# Patient Record
Sex: Male | Born: 1955
Health system: Southern US, Community
[De-identification: ages and names within clinical notes are randomized; demographics above are authoritative.]

## PROBLEM LIST (undated history)

## (undated) DIAGNOSIS — E785 Hyperlipidemia, unspecified: Secondary | ICD-10-CM

## (undated) DIAGNOSIS — I10 Essential (primary) hypertension: Secondary | ICD-10-CM

## (undated) HISTORY — DX: Hyperlipidemia, unspecified: E78.5

## (undated) HISTORY — PX: SPINE SURGERY: SHX786

## (undated) HISTORY — PX: HAND SURGERY: SHX662

## (undated) HISTORY — PX: CERVICAL DISC SURGERY: SHX588

---

## 1997-11-13 ENCOUNTER — Emergency Department (HOSPITAL_COMMUNITY): Admission: EM | Admit: 1997-11-13 | Discharge: 1997-11-13 | Payer: Self-pay | Admitting: *Deleted

## 1998-06-27 ENCOUNTER — Encounter: Payer: Self-pay | Admitting: Emergency Medicine

## 1998-06-27 ENCOUNTER — Emergency Department (HOSPITAL_COMMUNITY): Admission: EM | Admit: 1998-06-27 | Discharge: 1998-06-27 | Payer: Self-pay | Admitting: Emergency Medicine

## 2002-06-22 ENCOUNTER — Encounter: Payer: Self-pay | Admitting: Neurosurgery

## 2002-06-22 ENCOUNTER — Ambulatory Visit (HOSPITAL_COMMUNITY): Admission: RE | Admit: 2002-06-22 | Discharge: 2002-06-22 | Payer: Self-pay | Admitting: Neurosurgery

## 2002-06-22 ENCOUNTER — Encounter: Payer: Self-pay | Admitting: Sports Medicine

## 2002-06-24 ENCOUNTER — Ambulatory Visit (HOSPITAL_COMMUNITY): Admission: RE | Admit: 2002-06-24 | Discharge: 2002-06-25 | Payer: Self-pay | Admitting: Neurosurgery

## 2002-06-24 ENCOUNTER — Encounter: Payer: Self-pay | Admitting: Neurosurgery

## 2003-08-08 ENCOUNTER — Ambulatory Visit (HOSPITAL_COMMUNITY): Admission: RE | Admit: 2003-08-08 | Discharge: 2003-08-08 | Payer: Self-pay | Admitting: Family Medicine

## 2004-02-16 ENCOUNTER — Ambulatory Visit (HOSPITAL_COMMUNITY): Admission: RE | Admit: 2004-02-16 | Discharge: 2004-02-16 | Payer: Self-pay | Admitting: Neurosurgery

## 2011-11-20 ENCOUNTER — Inpatient Hospital Stay (HOSPITAL_COMMUNITY)
Admission: EM | Admit: 2011-11-20 | Discharge: 2011-11-23 | DRG: 494 | Disposition: A | Discharge status: Home / Self Care | Payer: BC Managed Care – PPO | Source: Ambulatory Visit | Attending: Surgery | Admitting: Surgery

## 2011-11-20 ENCOUNTER — Encounter (HOSPITAL_COMMUNITY): Payer: Self-pay | Admitting: *Deleted

## 2011-11-20 ENCOUNTER — Other Ambulatory Visit: Payer: Self-pay

## 2011-11-20 ENCOUNTER — Emergency Department (HOSPITAL_COMMUNITY): Payer: BC Managed Care – PPO

## 2011-11-20 DIAGNOSIS — F17200 Nicotine dependence, unspecified, uncomplicated: Secondary | ICD-10-CM | POA: Diagnosis present

## 2011-11-20 DIAGNOSIS — I10 Essential (primary) hypertension: Secondary | ICD-10-CM | POA: Diagnosis present

## 2011-11-20 DIAGNOSIS — K810 Acute cholecystitis: Secondary | ICD-10-CM

## 2011-11-20 DIAGNOSIS — K8000 Calculus of gallbladder with acute cholecystitis without obstruction: Principal | ICD-10-CM | POA: Diagnosis present

## 2011-11-20 HISTORY — DX: Essential (primary) hypertension: I10

## 2011-11-20 LAB — HEPATIC FUNCTION PANEL
ALT: 15 U/L (ref 0–53)
AST: 18 U/L (ref 0–37)
Albumin: 3.9 g/dL (ref 3.5–5.2)
Alkaline Phosphatase: 78 U/L (ref 39–117)
Bilirubin, Direct: 0.1 mg/dL (ref 0.0–0.3)
Indirect Bilirubin: 0.2 mg/dL — ABNORMAL LOW (ref 0.3–0.9)
Total Bilirubin: 0.3 mg/dL (ref 0.3–1.2)
Total Protein: 6.8 g/dL (ref 6.0–8.3)

## 2011-11-20 LAB — BASIC METABOLIC PANEL WITH GFR
BUN: 13 mg/dL (ref 6–23)
CO2: 22 meq/L (ref 19–32)
Calcium: 10.4 mg/dL (ref 8.4–10.5)
Chloride: 101 meq/L (ref 96–112)
Creatinine, Ser: 0.75 mg/dL (ref 0.50–1.35)
GFR calc Af Amer: >90 mL/min
GFR calc non Af Amer: >90 mL/min
Glucose, Bld: 109 mg/dL — ABNORMAL HIGH (ref 70–99)
Potassium: 4.1 meq/L (ref 3.5–5.1)
Sodium: 138 meq/L (ref 135–145)

## 2011-11-20 LAB — DIFFERENTIAL
Basophils Absolute: 0 K/uL (ref 0.0–0.1)
Basophils Relative: 0 % (ref 0–1)
Eosinophils Absolute: 0.1 K/uL (ref 0.0–0.7)
Eosinophils Relative: 1 % (ref 0–5)
Lymphocytes Relative: 13 % (ref 12–46)
Lymphs Abs: 2 K/uL (ref 0.7–4.0)
Monocytes Absolute: 0.8 K/uL (ref 0.1–1.0)
Monocytes Relative: 5 % (ref 3–12)
Neutro Abs: 12.1 K/uL — ABNORMAL HIGH (ref 1.7–7.7)
Neutrophils Relative %: 81 % — ABNORMAL HIGH (ref 43–77)

## 2011-11-20 LAB — CBC
HCT: 46.2 % (ref 39.0–52.0)
Hemoglobin: 16.8 g/dL (ref 13.0–17.0)
MCH: 33.2 pg (ref 26.0–34.0)
MCHC: 36.4 g/dL — ABNORMAL HIGH (ref 30.0–36.0)
MCV: 91.3 fL (ref 78.0–100.0)
Platelets: 232 K/uL (ref 150–400)
RBC: 5.06 MIL/uL (ref 4.22–5.81)
RDW: 13.5 % (ref 11.5–15.5)
WBC: 12.6 K/uL — ABNORMAL HIGH (ref 4.0–10.5)

## 2011-11-20 LAB — LIPASE, BLOOD: Lipase: 28 U/L (ref 11–59)

## 2011-11-20 LAB — POCT I-STAT TROPONIN I: Troponin i, poc: 0 ng/mL (ref 0.00–0.08)

## 2011-11-20 LAB — SURGICAL PCR SCREEN
MRSA, PCR: NEGATIVE
Staphylococcus aureus: NEGATIVE

## 2011-11-20 MED ORDER — CHLORHEXIDINE GLUCONATE 4 % EX LIQD
1.0000 "application " | Freq: Once | CUTANEOUS | Status: DC
  Filled 2011-11-20 (×2): qty 15

## 2011-11-20 MED ORDER — HYDROMORPHONE HCL PF 1 MG/ML IJ SOLN
1.0000 mg | Freq: Once | INTRAMUSCULAR | Status: AC
  Administered 2011-11-20: 1 mg via INTRAVENOUS
  Filled 2011-11-20: qty 1

## 2011-11-20 MED ORDER — DIPHENHYDRAMINE HCL 12.5 MG/5ML PO ELIX
12.5000 mg | ORAL_SOLUTION | Freq: Four times a day (QID) | ORAL | Status: DC | PRN
  Filled 2011-11-20: qty 10

## 2011-11-20 MED ORDER — LISINOPRIL 10 MG PO TABS
10.0000 mg | ORAL_TABLET | Freq: Every day | ORAL | Status: DC
  Administered 2011-11-21: 10 mg via ORAL
  Filled 2011-11-20 (×4): qty 1

## 2011-11-20 MED ORDER — HYDROMORPHONE HCL PF 1 MG/ML IJ SOLN
1.0000 mg | INTRAMUSCULAR | Status: DC | PRN
  Administered 2011-11-20 – 2011-11-21 (×3): 1 mg via INTRAVENOUS
  Filled 2011-11-20 (×4): qty 1

## 2011-11-20 MED ORDER — PANTOPRAZOLE SODIUM 40 MG IV SOLR
40.0000 mg | Freq: Every day | INTRAVENOUS | Status: DC
  Administered 2011-11-20 – 2011-11-22 (×3): 40 mg via INTRAVENOUS
  Filled 2011-11-20 (×4): qty 40

## 2011-11-20 MED ORDER — SODIUM CHLORIDE 0.9 % IV SOLN
1.0000 g | INTRAVENOUS | Status: DC
  Administered 2011-11-20 – 2011-11-22 (×3): 1 g via INTRAVENOUS
  Filled 2011-11-20 (×4): qty 1

## 2011-11-20 MED ORDER — DIPHENHYDRAMINE HCL 50 MG/ML IJ SOLN: 12.5000 mg | Freq: Four times a day (QID) | INTRAMUSCULAR | Status: DC | PRN

## 2011-11-20 MED ORDER — MORPHINE SULFATE 4 MG/ML IJ SOLN
4.0000 mg | Freq: Once | INTRAMUSCULAR | Status: AC
  Administered 2011-11-20: 4 mg via INTRAVENOUS
  Filled 2011-11-20: qty 1

## 2011-11-20 MED ORDER — POTASSIUM CHLORIDE IN NACL 20-0.9 MEQ/L-% IV SOLN
INTRAVENOUS | Status: DC
  Administered 2011-11-20 – 2011-11-21 (×2): via INTRAVENOUS
  Filled 2011-11-20 (×5): qty 1000

## 2011-11-20 MED ORDER — MIDAZOLAM HCL 2 MG/2ML IJ SOLN: 1.0000 mg | INTRAMUSCULAR | Status: DC | PRN

## 2011-11-20 MED ORDER — ONDANSETRON HCL 4 MG/2ML IJ SOLN
4.0000 mg | Freq: Four times a day (QID) | INTRAMUSCULAR | Status: DC | PRN
  Administered 2011-11-21: 4 mg via INTRAVENOUS
  Filled 2011-11-20: qty 2

## 2011-11-20 MED ORDER — MORPHINE SULFATE 2 MG/ML IJ SOLN
1.0000 mg | INTRAMUSCULAR | Status: DC | PRN
  Administered 2011-11-20: 4 mg via INTRAVENOUS
  Filled 2011-11-20: qty 2

## 2011-11-20 MED ORDER — CHLORHEXIDINE GLUCONATE 4 % EX LIQD
1.0000 "application " | Freq: Once | CUTANEOUS | Status: AC
  Administered 2011-11-21: 1 "application " via TOPICAL
  Filled 2011-11-20: qty 15

## 2011-11-20 MED ORDER — FENTANYL CITRATE 0.05 MG/ML IJ SOLN: 50.0000 ug | INTRAMUSCULAR | Status: DC | PRN

## 2011-11-20 MED ORDER — AMLODIPINE BESYLATE 5 MG PO TABS
5.0000 mg | ORAL_TABLET | Freq: Every day | ORAL | Status: DC
  Administered 2011-11-21: 5 mg via ORAL
  Filled 2011-11-20 (×4): qty 1

## 2011-11-20 NOTE — ED Notes (Signed)
Report received from [facility].  Assumed care of this patient at this time.

## 2011-11-20 NOTE — ED Provider Notes (Signed)
History     CSN: [id number]  Arrival date & time 11/20/11  1116   First MD Initiated Contact with Patient 11/20/11 1143      Chief Complaint  Patient presents with  . Weakness  . Abdominal Pain    (Consider location/radiation/quality/duration/timing/severity/associated sxs/prior treatment) HPI Comments: Started this morning at about 8AM with feeling very uncomfortable, tightness in the upper abdomen.  He can't get comfortable.  Feels nauseated but no vomiting.  No fevers.  No recent exertional symptoms.  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  Patient is a 56 y.o. male presenting with abdominal pain. The history is provided by the patient.  Abdominal Pain The primary symptoms of the illness include abdominal pain and nausea. The primary symptoms of the illness do not include fever, vomiting or diarrhea. Episode onset: at 8AM. The onset of the illness was sudden. The problem has been rapidly worsening.  The illness is associated with alcohol use. The patient has not had a change in bowel habit. Symptoms associated with the illness do not include chills, constipation, frequency or back pain.    Past Medical History  Diagnosis Date  . Hypertension     Past Surgical History  Procedure Date  . Cervical disc surgery     No family history on file.  History  Substance Use Topics  . Smoking status: Current Everyday Smoker  . Smokeless tobacco: Not on file  . Alcohol Use: Yes      Review of Systems  Constitutional: Negative for fever and chills.  Gastrointestinal: Positive for nausea and abdominal pain. Negative for vomiting, diarrhea and constipation.  Genitourinary: Negative for frequency.  Musculoskeletal: Negative for back pain.  All other systems reviewed and are negative.    Allergies  Review of patient's allergies indicates no known allergies.  Home Medications   Current Outpatient Rx  Name Route Sig Dispense Refill  .  AMLODIPINE BESYLATE 5 MG PO TABS Oral Take 5 mg by mouth daily.    . ASPIRIN 81 MG PO TABS Oral Take 81 mg by mouth daily.    Marland Kitchen CALCIUM CARBONATE ANTACID 500 MG PO CHEW Oral Chew 4 tablets by mouth once.    Marland Kitchen LISINOPRIL 10 MG PO TABS Oral Take 10 mg by mouth daily.      BP 187/92  Pulse 88  Temp 97.7 F (36.5 C) (Oral)  Resp 22  Ht 5\' 7"  (1.702 m)  Wt 180 lb (81.647 kg)  BMI 28.19 kg/m2  SpO2 99%  Physical Exam  Nursing note and vitals reviewed. Constitutional: He is oriented to person, place, and time. He appears well-developed and well-nourished. No distress.  HENT:  Head: Normocephalic and atraumatic.  Neck: Normal range of motion. Neck supple.  Cardiovascular: Normal rate and regular rhythm.   Pulmonary/Chest: Effort normal and breath sounds normal. No respiratory distress.  Abdominal: Soft. Bowel sounds are normal. He exhibits no distension.       There is ttp in the epigastrium, but no rebound or guarding.    Musculoskeletal: Normal range of motion. He exhibits no edema.  Neurological: He is alert and oriented to person, place, and time.  Skin: Skin is warm and dry. He is not diaphoretic.    ED Course  Procedures (including critical care time)   Labs Reviewed  CBC  BASIC METABOLIC PANEL  LIPASE, BLOOD  HEPATIC FUNCTION PANEL   No results found.   No diagnosis found.   Date: 11/20/2011  Rate: 87  Rhythm: normal sinus rhythm  QRS Axis: normal  Intervals: normal  ST/T Wave abnormalities: normal  Conduction Disutrbances:none  Narrative Interpretation:   Old EKG Reviewed: unchanged    MDM  I have consulted general surgery for evaluation as I suspect this is cholecystitis.  The US shows stones but no thickening of the gallbladder wall.          Geoffery Lyons, MD 11/20/11 [phone]

## 2011-11-20 NOTE — ED Notes (Signed)
Patient to go to OR tomorrow for cholecystectomy.  Pt reports mild abdominal  Pain.  Patient denies nausea or vomiting at this time.  Patient declined pain medication at this time.

## 2011-11-20 NOTE — ED Notes (Signed)
Report was called to Alona Bene, RN on 5100.  Patient to go to [id number].

## 2011-11-20 NOTE — ED Notes (Signed)
Patient was in [place] attending several social events last PM. States that he woke up this AM feeling bad.  C/O abdominal pain and bloating also Nausea. Abdomen is round and soft, non-tender to palpation. Denies vomiting.

## 2011-11-20 NOTE — ED Notes (Signed)
Called report to floor. RN to call back

## 2011-11-20 NOTE — ED Notes (Signed)
Patient states he woke up today at 0600,  Had 3 stools,  Denies nausea.  Patient states he just feels weak.  Patient reports he has abd pain versus chest pain.  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  He flew home this morning and continues to feel weak and sick with abd pain.

## 2011-11-20 NOTE — H&P (Signed)
Jeremy Rios [birth year]  [id number].   Primary Care MD: Dr. Earl Gala Requesting MD: Dr. 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  He presented to the [facility] due to the severity.  His work up included a negative cardiac workup.  He had a mildly elevated WBC, normal LFTs and normal Lipase.  His abdominal ultrasound showed gallstones.  He has never had similar symptoms before.  His pain and nausea are not resolving at this time.  He denies vomiting, fever, chills or diarrhea.  Review of systems: All systems reviewed and negative except as indicated in HPI.  No family history on file.  Past Medical History  Diagnosis Date  . Hypertension     Past Surgical History  Procedure Date  . Cervical disc surgery     Social History:  reports that he has been smoking.  He does not have any smokeless tobacco history on file. He reports that he drinks alcohol. He reports that he does not use illicit drugs.  Allergies: No Known Allergies   (Not in a hospital admission)  Blood pressure 180/75, pulse 59, temperature 97.7 F (36.5 C), temperature source Oral, resp. rate 16, height 5\' 7"  (1.702 m), weight 180 lb (81.647 kg), SpO2 96.00%. Physical Exam: General:  WDWN in NAD.  Pleasant and cooperative.  HEENT:  NCAT, EOMI, no icterus.  NECK:  Supple, no obvious mass or thyroid enlargement.  CV:  RRR, no murmur, no JVD.  CHEST:  No scars.  RESPIRATORY:  Breath sounds equal and clear. Respirations nonlabored.  ABDOMEN:  Soft, tender in the RUQ with guarding. nondistended, no masses, no organomegaly, active bowel sounds, no scars, no hernias.  ANORECTAL:  Deferred.  GU:  Deferred.  MUSCULOSKELETAL:  FROM, good muscle tone, no edema, no venous stasis changes  LYMPHATIC: No palpable cervical, supraclavicular, axillary adenopathy.  SKIN:  No  jaundice or suspicious rashes.  NEUROLOGIC:  Alert and oriented, answers questions appropriately, moves upper and lower extremities equally.    Results for orders placed during the hospital encounter of 11/20/11 (from the past 48 hour(s))  CBC     Status: Abnormal   Collection Time   11/20/11 11:50 AM      Component Value Range Comment   WBC 12.6 (*) 4.0 - 10.5 K/uL    RBC 5.06  4.22 - 5.81 MIL/uL    Hemoglobin 16.8  13.0 - 17.0 g/dL    HCT 40.9  81.1 - 91.4 %    MCV 91.3  78.0 - 100.0 fL    MCH 33.2  26.0 - 34.0 pg    MCHC 36.4 (*) 30.0 - 36.0 g/dL    RDW 78.2  95.6 - 21.3 %    Platelets 232  150 - 400 K/uL   BASIC METABOLIC PANEL     Status: Abnormal   Collection Time   11/20/11 11:50 AM      Component Value Range Comment   Sodium 138  135 - 145 mEq/L    Potassium 4.1  3.5 - 5.1 mEq/L    Chloride 101  96 - 112 mEq/L    CO2 22  19 - 32 mEq/L    Glucose, Bld 109 (*) 70 - 99 mg/dL    BUN 13  6 - 23 mg/dL    Creatinine, Ser 0.86  0.50 - 1.35 mg/dL    Calcium 57.8  8.4 -  10.5 mg/dL    GFR calc non Af Amer >90  >90 mL/min    GFR calc Af Amer >90  >90 mL/min   POCT I-STAT TROPONIN I     Status: Normal   Collection Time   11/20/11 12:37 PM      Component Value Range Comment   Troponin i, poc 0.00  0.00 - 0.08 ng/mL    Comment 3            LIPASE, BLOOD     Status: Normal   Collection Time   11/20/11 12:39 PM      Component Value Range Comment   Lipase 28  11 - 59 U/L   HEPATIC FUNCTION PANEL     Status: Abnormal   Collection Time   11/20/11 12:39 PM      Component Value Range Comment   Total Protein 6.8  6.0 - 8.3 g/dL    Albumin 3.9  3.5 - 5.2 g/dL    AST 18  0 - 37 U/L    ALT 15  0 - 53 U/L    Alkaline Phosphatase 78  39 - 117 U/L    Total Bilirubin 0.3  0.3 - 1.2 mg/dL    Bilirubin, Direct 0.1  0.0 - 0.3 mg/dL    Indirect Bilirubin 0.2 (*) 0.3 - 0.9 mg/dL    US Abdomen Complete  11/20/2011  *RADIOLOGY REPORT*  Clinical Data:  Right upper quadrant abdominal pain   ABDOMINAL ULTRASOUND COMPLETE  Comparison:  No similar prior study is available for comparison.  Findings:  Gallbladder:  Multiple gallstones are noted within the gallbladder lumen, largest 2.1 cm.  No sonographic Murphy's sign.  No gallbladder wall thickening.  No pericholecystic fluid.  Common Bile Duct:  4 mm.  Normal.  Liver: No focal mass lesion identified.  Within normal limits in parenchymal echogenicity.  IVC:  Appears normal.  Pancreas:  Head and body appear normal.  Tail partly obscured by bowel gas.  Spleen:  Within normal limits in size and echotexture.  Right kidney:  Normal in size and parenchymal echogenicity.  No evidence of mass or hydronephrosis.  Left kidney:  Normal in size and parenchymal echogenicity.  No evidence of mass or hydronephrosis.  Abdominal Aorta:  No aneurysm identified.  IMPRESSION: Gallstones without other sonographic evidence for acute cholecystitis. Presence or absence of sonographic Murphy's sign not well assessed due to patient medication.  If there is persistent concern for acute cholecystitis, HIDA scan could be obtained for functional information.  Original Report Authenticated By: Harrel Lemon, M.D.   Chest Portable 1 View  11/20/2011  *RADIOLOGY REPORT*  Clinical Data: Weakness and abdominal pain.  PORTABLE CHEST - 1 VIEW  Comparison: 08/08/2003  Findings: Normal heart size.  Linear atelectasis at the left base. Right lung is clear.  No pneumothorax.  No pleural effusion.  IMPRESSION: Linear atelectasis at the left lung base.  Original Report Authenticated By: Donavan Burnet, M.D.       Assessment/Plan 1.  Cholelithiasis/mild acute cholecystitis: the patient will be admitted, started on IV fluids, abx, prn pain meds and prn antiemetics.  Likely the patient will need a laparoscopic cholecystectomy.  The patient will be seen by Dr. Jamey Ripa in the ER as well.    Inara Dike 11/20/2011, 4:14 PM

## 2011-11-20 NOTE — H&P (Signed)
I have seen and examined the patient and agree with the A&P of EW,PA. He has a large stone in the neck of the GB and is tender now in the RUQ with wbc of 12K.   I believe he should go ahead with LC/IOC and have discussed with him. I have discussed the indications for laparoscopic cholecystectomy with him and provided educational material. We have discussed the risks of surgery, including general risks such as bleeding, infection, lung and heart issues etc. We have also discussed the potential for injuries to other organs, bile duct leaks, and other unexpected events. We have also talked about the fact that this may need to be converted to open under certain circumstances. We discussed the typical post op recovery and the fact that there is a good likelihood of improvement in symptoms and return to normal activity.  He understands this and wishes to proceed to schedule surgery. I believe all of his questions have been answered.  He can have sips of liquids tonight and will do in AM. Will be on pre-op antibiotics

## 2011-11-20 NOTE — ED Notes (Signed)
Care transferred and report given to [place], [place].

## 2011-11-21 ENCOUNTER — Inpatient Hospital Stay (HOSPITAL_COMMUNITY): Payer: BC Managed Care – PPO | Admitting: Anesthesiology

## 2011-11-21 ENCOUNTER — Encounter (HOSPITAL_COMMUNITY)
Admission: EM | Disposition: A | Discharge status: Home / Self Care | Payer: Self-pay | Source: Ambulatory Visit | Attending: Surgery

## 2011-11-21 ENCOUNTER — Inpatient Hospital Stay (HOSPITAL_COMMUNITY): Payer: BC Managed Care – PPO

## 2011-11-21 ENCOUNTER — Encounter (HOSPITAL_COMMUNITY): Payer: Self-pay | Admitting: Anesthesiology

## 2011-11-21 HISTORY — PX: CHOLECYSTECTOMY: SHX55

## 2011-11-21 LAB — CBC
HCT: 41.1 % (ref 39.0–52.0)
Hemoglobin: 14.7 g/dL (ref 13.0–17.0)
MCH: 32.5 pg (ref 26.0–34.0)
MCHC: 35.8 g/dL (ref 30.0–36.0)
MCV: 90.9 fL (ref 78.0–100.0)
Platelets: 202 K/uL (ref 150–400)
RBC: 4.52 MIL/uL (ref 4.22–5.81)
RDW: 13.4 % (ref 11.5–15.5)
WBC: 16.1 K/uL — ABNORMAL HIGH (ref 4.0–10.5)

## 2011-11-21 LAB — COMPREHENSIVE METABOLIC PANEL WITH GFR
ALT: 17 U/L (ref 0–53)
AST: 15 U/L (ref 0–37)
Albumin: 3.4 g/dL — ABNORMAL LOW (ref 3.5–5.2)
Alkaline Phosphatase: 80 U/L (ref 39–117)
BUN: 7 mg/dL (ref 6–23)
CO2: 23 meq/L (ref 19–32)
Calcium: 9.2 mg/dL (ref 8.4–10.5)
Chloride: 100 meq/L (ref 96–112)
Creatinine, Ser: 0.76 mg/dL (ref 0.50–1.35)
GFR calc Af Amer: >90 mL/min
GFR calc non Af Amer: >90 mL/min
Glucose, Bld: 104 mg/dL — ABNORMAL HIGH (ref 70–99)
Potassium: 4.1 meq/L (ref 3.5–5.1)
Sodium: 134 meq/L — ABNORMAL LOW (ref 135–145)
Total Bilirubin: 0.7 mg/dL (ref 0.3–1.2)
Total Protein: 6.2 g/dL (ref 6.0–8.3)

## 2011-11-21 SURGERY — LAPAROSCOPIC CHOLECYSTECTOMY WITH INTRAOPERATIVE CHOLANGIOGRAM
Anesthesia: General | Site: Abdomen | Wound class: Dirty or Infected

## 2011-11-21 MED ORDER — LIDOCAINE HCL (CARDIAC) 20 MG/ML IV SOLN
INTRAVENOUS | Status: DC | PRN
  Administered 2011-11-21: 80 mg via INTRAVENOUS

## 2011-11-21 MED ORDER — LACTATED RINGERS IV SOLN
INTRAVENOUS | Status: DC | PRN
  Administered 2011-11-21 (×2): via INTRAVENOUS

## 2011-11-21 MED ORDER — MIDAZOLAM HCL 5 MG/5ML IJ SOLN
INTRAMUSCULAR | Status: DC | PRN
  Administered 2011-11-21: 2 mg via INTRAVENOUS

## 2011-11-21 MED ORDER — SODIUM CHLORIDE 0.9 % IV SOLN
INTRAVENOUS | Status: DC | PRN
  Administered 2011-11-21: 15:00:00

## 2011-11-21 MED ORDER — LACTATED RINGERS IV SOLN
INTRAVENOUS | Status: DC
  Administered 2011-11-21: 13:00:00 via INTRAVENOUS

## 2011-11-21 MED ORDER — SODIUM CHLORIDE 0.9 % IR SOLN
Status: DC | PRN
  Administered 2011-11-21: 1000 mL

## 2011-11-21 MED ORDER — LABETALOL HCL 5 MG/ML IV SOLN
INTRAVENOUS | Status: DC | PRN
  Administered 2011-11-21: 5 mg via INTRAVENOUS

## 2011-11-21 MED ORDER — KCL IN DEXTROSE-NACL 20-5-0.45 MEQ/L-%-% IV SOLN
INTRAVENOUS | Status: DC
  Administered 2011-11-21: 100 mL/h via INTRAVENOUS
  Administered 2011-11-22 (×2): via INTRAVENOUS
  Filled 2011-11-21 (×4): qty 1000

## 2011-11-21 MED ORDER — PROPOFOL 10 MG/ML IV EMUL
INTRAVENOUS | Status: DC | PRN
  Administered 2011-11-21: 180 mg via INTRAVENOUS

## 2011-11-21 MED ORDER — ONDANSETRON HCL 4 MG PO TABS: 4.0000 mg | ORAL_TABLET | Freq: Four times a day (QID) | ORAL | Status: DC | PRN

## 2011-11-21 MED ORDER — MEPERIDINE HCL 25 MG/ML IJ SOLN: 6.2500 mg | INTRAMUSCULAR | Status: DC | PRN

## 2011-11-21 MED ORDER — DEXAMETHASONE SODIUM PHOSPHATE 10 MG/ML IJ SOLN
INTRAMUSCULAR | Status: DC | PRN
  Administered 2011-11-21: 8 mg via INTRAVENOUS

## 2011-11-21 MED ORDER — ONDANSETRON HCL 4 MG/2ML IJ SOLN: 4.0000 mg | Freq: Four times a day (QID) | INTRAMUSCULAR | Status: DC | PRN

## 2011-11-21 MED ORDER — BUPIVACAINE HCL (PF) 0.25 % IJ SOLN
INTRAMUSCULAR | Status: AC
  Filled 2011-11-21: qty 30

## 2011-11-21 MED ORDER — HYDROMORPHONE HCL PF 1 MG/ML IJ SOLN
INTRAMUSCULAR | Status: AC
  Filled 2011-11-21: qty 1

## 2011-11-21 MED ORDER — KETOROLAC TROMETHAMINE 30 MG/ML IJ SOLN
15.0000 mg | Freq: Once | INTRAMUSCULAR | Status: AC | PRN
  Administered 2011-11-21: 30 mg via INTRAVENOUS

## 2011-11-21 MED ORDER — HYDROMORPHONE HCL PF 1 MG/ML IJ SOLN
0.2500 mg | INTRAMUSCULAR | Status: DC | PRN
  Administered 2011-11-21: 0.5 mg via INTRAVENOUS

## 2011-11-21 MED ORDER — HYDROMORPHONE HCL PF 1 MG/ML IJ SOLN
2.0000 mg | INTRAMUSCULAR | Status: DC | PRN
  Administered 2011-11-21 – 2011-11-22 (×2): 1 mg via INTRAVENOUS
  Filled 2011-11-21: qty 1

## 2011-11-21 MED ORDER — GLYCOPYRROLATE 0.2 MG/ML IJ SOLN
INTRAMUSCULAR | Status: DC | PRN
  Administered 2011-11-21: .4 mg via INTRAVENOUS

## 2011-11-21 MED ORDER — BUPIVACAINE HCL (PF) 0.25 % IJ SOLN
INTRAMUSCULAR | Status: DC | PRN
  Administered 2011-11-21: 30 mL

## 2011-11-21 MED ORDER — NEOSTIGMINE METHYLSULFATE 1 MG/ML IJ SOLN
INTRAMUSCULAR | Status: DC | PRN
  Administered 2011-11-21: 5 mg via INTRAVENOUS

## 2011-11-21 MED ORDER — FENTANYL CITRATE 0.05 MG/ML IJ SOLN
INTRAMUSCULAR | Status: DC | PRN
  Administered 2011-11-21: 100 ug via INTRAVENOUS
  Administered 2011-11-21: 150 ug via INTRAVENOUS
  Administered 2011-11-21: 100 ug via INTRAVENOUS

## 2011-11-21 MED ORDER — SODIUM CHLORIDE 0.9 % IR SOLN
Status: DC | PRN
  Administered 2011-11-21: 1 "application "

## 2011-11-21 MED ORDER — KCL IN DEXTROSE-NACL 20-5-0.45 MEQ/L-%-% IV SOLN
INTRAVENOUS | Status: AC
  Filled 2011-11-21: qty 1000

## 2011-11-21 MED ORDER — OXYCODONE-ACETAMINOPHEN 5-325 MG PO TABS
1.0000 | ORAL_TABLET | ORAL | Status: DC | PRN
  Administered 2011-11-22 – 2011-11-23 (×3): 2 via ORAL
  Filled 2011-11-21 (×3): qty 2

## 2011-11-21 MED ORDER — ROCURONIUM BROMIDE 100 MG/10ML IV SOLN
INTRAVENOUS | Status: DC | PRN
  Administered 2011-11-21: 10 mg via INTRAVENOUS
  Administered 2011-11-21: 50 mg via INTRAVENOUS
  Administered 2011-11-21: 10 mg via INTRAVENOUS

## 2011-11-21 MED ORDER — ONDANSETRON HCL 4 MG/2ML IJ SOLN
INTRAMUSCULAR | Status: DC | PRN
  Administered 2011-11-21: 4 mg via INTRAVENOUS

## 2011-11-21 MED ORDER — HEMOSTATIC AGENTS (NO CHARGE) OPTIME
TOPICAL | Status: DC | PRN
  Administered 2011-11-21: 2 "application " via TOPICAL

## 2011-11-21 MED ORDER — PROMETHAZINE HCL 25 MG/ML IJ SOLN: 6.2500 mg | INTRAMUSCULAR | Status: DC | PRN

## 2011-11-21 SURGICAL SUPPLY — 54 items
ADH SKN CLS APL DERMABOND .7 (GAUZE/BANDAGES/DRESSINGS) ×1
APPLIER CLIP ROT 10 11.4 M/L (STAPLE)
APR CLP MED LRG 11.4X10 (STAPLE)
BAG SPEC RTRVL LRG 6X4 10 (ENDOMECHANICALS) ×1
BLADE SURG ROTATE 9660 (MISCELLANEOUS) ×2
CANISTER SUCTION 2500CC (MISCELLANEOUS) ×2
CHLORAPREP W/TINT 26ML (MISCELLANEOUS) ×2
CLOTH BEACON ORANGE TIMEOUT ST (SAFETY) ×2
COVER MAYO STAND STRL (DRAPES) ×2
COVER SURGICAL LIGHT HANDLE (MISCELLANEOUS) ×2
DECANTER SPIKE VIAL GLASS SM (MISCELLANEOUS)
DERMABOND ADVANCED (GAUZE/BANDAGES/DRESSINGS) ×1
DERMABOND ADVANCED .7 DNX12 (GAUZE/BANDAGES/DRESSINGS) ×1
DRAIN CHANNEL 19F RND (DRAIN) ×2
DRAPE C-ARM 42X72 X-RAY (DRAPES) ×2
DRAPE UTILITY 15X26 W/TAPE STR (DRAPE) ×4
ELECT REM PT RETURN 9FT ADLT (ELECTROSURGICAL) ×2
EVACUATOR SILICONE 100CC (DRAIN) ×2
FILTER SMOKE EVAC LAPAROSHD (FILTER)
GAUZE SPONGE 2X2 8PLY STRL LF (GAUZE/BANDAGES/DRESSINGS) ×1
GLOVE BIO SURGEON STRL SZ 6.5 (GLOVE) ×2
GLOVE BIO SURGEON STRL SZ7.5 (GLOVE) ×2
GLOVE BIOGEL PI IND STRL 7.0 (GLOVE) ×1
GLOVE BIOGEL PI IND STRL 7.5 (GLOVE) ×1
GLOVE BIOGEL PI INDICATOR 7.0 (GLOVE) ×1
GLOVE BIOGEL PI INDICATOR 7.5 (GLOVE) ×1
GLOVE ECLIPSE 6.5 STRL STRAW (GLOVE) ×2
GLOVE EUDERMIC 7 POWDERFREE (GLOVE) ×2
GLOVE EXAM NITRILE MD LF STRL (GLOVE) ×2
GLOVE SURG SIGNA 7.5 PF LTX (GLOVE) ×2
GOWN PREVENTION PLUS XLARGE (GOWN DISPOSABLE) ×4
GOWN STRL NON-REIN LRG LVL3 (GOWN DISPOSABLE) ×6
HEMOSTAT SNOW SURGICEL 2X4 (HEMOSTASIS) ×4
KIT BASIN OR (CUSTOM PROCEDURE TRAY) ×2
KIT ROOM TURNOVER OR (KITS) ×2
NS IRRIG 1000ML POUR BTL (IV SOLUTION) ×2
PAD ARMBOARD 7.5X6 YLW CONV (MISCELLANEOUS) ×2
POUCH SPECIMEN RETRIEVAL 10MM (ENDOMECHANICALS) ×2
SCISSORS LAP 5X35 DISP (ENDOMECHANICALS)
SET CHOLANGIOGRAPH 5 50 .035 (SET/KITS/TRAYS/PACK) ×2
SET IRRIG TUBING LAPAROSCOPIC (IRRIGATION / IRRIGATOR) ×2
SLEEVE Z-THREAD 5X100MM (TROCAR) ×2
SPECIMEN JAR SMALL (MISCELLANEOUS) ×2
SPONGE GAUZE 2X2 STER 10/PKG (GAUZE/BANDAGES/DRESSINGS) ×1
SUT ETHILON 2 0 FS 18 (SUTURE) ×2
SUT MNCRL AB 4-0 PS2 18 (SUTURE) ×4
TAPE CLOTH SURG 4X10 WHT LF (GAUZE/BANDAGES/DRESSINGS) ×2
TOWEL OR 17X24 6PK STRL BLUE (TOWEL DISPOSABLE) ×2
TOWEL OR 17X26 10 PK STRL BLUE (TOWEL DISPOSABLE) ×2
TRAY LAPAROSCOPIC (CUSTOM PROCEDURE TRAY) ×2
TROCAR XCEL BLUNT TIP 100MML (ENDOMECHANICALS) ×2
TROCAR Z-THREAD FIOS 11X100 BL (TROCAR) ×2
TROCAR Z-THREAD FIOS 5X100MM (TROCAR) ×2
WATER STERILE IRR 1000ML POUR (IV SOLUTION)

## 2011-11-21 NOTE — Transfer of Care (Signed)
Immediate Anesthesia Transfer of Care Note  Patient: Jeremy Rios  Procedure(s) Performed: Procedure(s) (LRB): LAPAROSCOPIC CHOLECYSTECTOMY WITH INTRAOPERATIVE CHOLANGIOGRAM (N/A)  Patient Location: PACU  Anesthesia Type: General  Level of Consciousness: awake and confused  Airway & Oxygen Therapy: Patient Spontanous Breathing and Patient connected to nasal cannula oxygen  Post-op Assessment: Report given to PACU RN, Post -op Vital signs reviewed and stable and Patient moving all extremities X 4  Post vital signs: Reviewed and stable  Complications: No apparent anesthesia complications

## 2011-11-21 NOTE — Preoperative (Signed)
Beta Blockers   Reason not to administer Beta Blockers:Not Applicable 

## 2011-11-21 NOTE — Progress Notes (Signed)
Acute cholecystitis  Subjective: Some nausea this am and still ruq pain. Not any better overnight wants to proceed to surgery  Objective: Vital signs in last 24 hours: Temp:  [97.7 F (36.5 C)-99.5 F (37.5 C)] 99.5 F (37.5 C) (06/20 0653) Pulse Rate:  [58-93] 93  (06/20 0653) Resp:  [16-22] 18  (06/20 0653) BP: (129-187)/(72-96) 129/72 mmHg (06/20 0653) SpO2:  [93 %-99 %] 93 % (06/20 0653) Weight:  [180 lb (81.647 kg)-180 lb 2.2 oz (81.71 kg)] 180 lb 2.2 oz (81.71 kg) (06/20 0653) Last BM Date: 11/20/11  Intake/Output from previous day: 06/19 0701 - 06/20 0700 In: 1403 [P.O.:360; I.V.:993; IV Piggyback:50] Out: 1100 [Urine:1100] Intake/Output this shift:    General appearance: alert, cooperative and mild distress GI: Tender RUQ.  Lab Results:  Results for orders placed during the hospital encounter of 11/20/11 (from the past 24 hour(s))  CBC     Status: Abnormal   Collection Time   11/20/11 11:50 AM      Component Value Range   WBC 12.6 (*) 4.0 - 10.5 K/uL   RBC 5.06  4.22 - 5.81 MIL/uL   Hemoglobin 16.8  13.0 - 17.0 g/dL   HCT 16.1  09.6 - 04.5 %   MCV 91.3  78.0 - 100.0 fL   MCH 33.2  26.0 - 34.0 pg   MCHC 36.4 (*) 30.0 - 36.0 g/dL   RDW 40.9  81.1 - 91.4 %   Platelets 232  150 - 400 K/uL  BASIC METABOLIC PANEL     Status: Abnormal   Collection Time   11/20/11 11:50 AM      Component Value Range   Sodium 138  135 - 145 mEq/L   Potassium 4.1  3.5 - 5.1 mEq/L   Chloride 101  96 - 112 mEq/L   CO2 22  19 - 32 mEq/L   Glucose, Bld 109 (*) 70 - 99 mg/dL   BUN 13  6 - 23 mg/dL   Creatinine, Ser 7.82  0.50 - 1.35 mg/dL   Calcium 95.6  8.4 - 21.3 mg/dL   GFR calc non Af Amer >90  >90 mL/min   GFR calc Af Amer >90  >90 mL/min  POCT I-STAT TROPONIN I     Status: Normal   Collection Time   11/20/11 12:37 PM      Component Value Range   Troponin i, poc 0.00  0.00 - 0.08 ng/mL   Comment 3           LIPASE, BLOOD     Status: Normal   Collection Time   11/20/11 12:39  PM      Component Value Range   Lipase 28  11 - 59 U/L  HEPATIC FUNCTION PANEL     Status: Abnormal   Collection Time   11/20/11 12:39 PM      Component Value Range   Total Protein 6.8  6.0 - 8.3 g/dL   Albumin 3.9  3.5 - 5.2 g/dL   AST 18  0 - 37 U/L   ALT 15  0 - 53 U/L   Alkaline Phosphatase 78  39 - 117 U/L   Total Bilirubin 0.3  0.3 - 1.2 mg/dL   Bilirubin, Direct 0.1  0.0 - 0.3 mg/dL   Indirect Bilirubin 0.2 (*) 0.3 - 0.9 mg/dL  DIFFERENTIAL     Status: Abnormal   Collection Time   11/20/11  8:13 PM      Component Value Range   Neutrophils  Relative 81 (*) 43 - 77 %   Neutro Abs 12.1 (*) 1.7 - 7.7 K/uL   Lymphocytes Relative 13  12 - 46 %   Lymphs Abs 2.0  0.7 - 4.0 K/uL   Monocytes Relative 5  3 - 12 %   Monocytes Absolute 0.8  0.1 - 1.0 K/uL   Eosinophils Relative 1  0 - 5 %   Eosinophils Absolute 0.1  0.0 - 0.7 K/uL   Basophils Relative 0  0 - 1 %   Basophils Absolute 0.0  0.0 - 0.1 K/uL  SURGICAL PCR SCREEN     Status: Normal   Collection Time   11/20/11  9:56 PM      Component Value Range   MRSA, PCR NEGATIVE  NEGATIVE   Staphylococcus aureus NEGATIVE  NEGATIVE     Studies/Results Radiology     MEDS, Scheduled    . amLODipine  5 mg Oral Daily  . chlorhexidine  1 application Topical Once  . chlorhexidine  1 application Topical Once  . ertapenem (INVANZ) IV  1 g Intravenous Q24H  .  HYDROmorphone (DILAUDID) injection  1 mg Intravenous Once  .  HYDROmorphone (DILAUDID) injection  1 mg Intravenous Once  . lisinopril  10 mg Oral Daily  .  morphine injection  4 mg Intravenous Once  . pantoprazole (PROTONIX) IV  40 mg Intravenous QHS     Assessment: Acute cholecystitis Acute cholecystitis Labs pending this am  Plan: Plan lap chole later this morning. Questions answered   LOS: 1 day    Currie Paris, MD, [facility], [place] [phone]   11/21/2011 7:33 AM

## 2011-11-21 NOTE — Anesthesia Postprocedure Evaluation (Signed)
  Anesthesia Post-op Note  Patient: Jeremy Rios  Procedure(s) Performed: Procedure(s) (LRB): LAPAROSCOPIC CHOLECYSTECTOMY WITH INTRAOPERATIVE CHOLANGIOGRAM (N/A)  Patient Location: PACU  Anesthesia Type: General  Level of Consciousness: awake  Airway and Oxygen Therapy: Patient Spontanous Breathing  Post-op Pain: mild  Post-op Assessment: Post-op Vital signs reviewed, Patient's Cardiovascular Status Stable, Respiratory Function Stable, Patent Airway, No signs of Nausea or vomiting and Pain level controlled  Post-op Vital Signs: stable  Complications: No apparent anesthesia complications

## 2011-11-21 NOTE — Op Note (Signed)
Jeremy Rios 10-15-1955 [id number] 11/20/2011  Preoperative diagnosis: Acute calculus cholecystitis  Postoperative diagnosis: The same  Procedure: Laparoscopic cholecystectomy with intraoperative cholangiogram  Surgeon: Currie Paris, MD, FACS  Assistant: Dr. Abigail Miyamoto  Anesthesia: General   Clinical History and Indications: This patient presented yesterday to the emergency room with signs and symptoms and findings of acute cholecystitis. He had only been symptomatic about 24 hours. Cholecystectomy was recommended and he was brought to the operating room today for surgery. His pain it increased overnight prior to surgery.    Description of Procedure: I saw the patient in the preoperative area and confirmed the plans for surgery. He had no further questions. He was taken to the operating room and after satisfactory general endotracheal anesthesia had been obtained the abdomen was prepped and draped. A timeout was done.  I used 0.25% plain Marcaine for each incision. The umbilical incision was made the fascia identified and opened and the peritoneal cavity entered under direct vision. He is on cannula was placed and the abdomen insufflated to 15. The patient was placed in reverse Trendelenburg and tilted to the left. A 1011 trocar was placed in the epigastrium and two five millimeter trochars in the right upper quadrant.  The gallbladder was acutely inflamed, thickwalled, red, with early gangrenous changes. I used the suction aspirator to decompress it. There are multiple middle occasions coming up on the gallbladder. The gallbladder track of these were taken down bluntly. I did use cautery as well. There is a very large cystic duct lymph node that was dissected off the cystic duct. Using primarily hydrodissection was able to free up the cystic duct and cystic artery and created a nice window and identified the anatomy. I put a clip on the cystic duct and one on the cystic  artery.  The cystic duct was then used for cholangiogram. A Cook catheter was placed percutaneously into the cystic duct. The cystic duct was fairly edematous so I didn't have a good seal and a little bit of leakage around the catheter occurred while redoing the cholangiogram out. However the plantar fascia good filling of the common duct pancreatic was in the duodenum with no evidence of stones.  The catheter was removed and 3 clips placed on the stay side of the cystic duct and it was divided. Additional clips were placed on the artery and it was divided. The gallbladder was removed from below to above. The back wall was necrotic somewhat and there was no good plane of separation. We did get the gallbladder  out completely however. It was placed in the bag.  I irrigated and made sure everything was dry. We had to cauterize the The bed of the liver. However there was a lot of bleeding at this point but there had been significant oozing during the cholecystectomy.  Once everything was dry I put some SNOW on the bed. I then put a 19 Blake drain in through the lateral port. The gallbladder was removed through the umbilical port. A final check was made for hemostasis. The remaining lateral port was removed. The umbilical port site was closed with a pursestring. The abdomen was deflated through the epigastric port which was removed. The skin was closed with 4-0 Monocryl subcuticular and Dermabond.  The patient tolerated procedure well. There were no operative complications. Counts were correct. Estimated blood loss was about 200 cc.  Currie Paris, MD, FACS 11/21/2011 3:53 PM

## 2011-11-21 NOTE — Anesthesia Preprocedure Evaluation (Addendum)
Anesthesia Evaluation  Patient identified by MRN, date of birth, ID band Patient awake    Reviewed: Allergy & Precautions, H&P , NPO status   History of Anesthesia Complications (+) DIFFICULT AIRWAYNegative for: history of anesthetic complications  Airway Mallampati: III  Neck ROM: Limited  Mouth opening: Limited Mouth Opening  Dental  (+) Teeth Intact and Dental Advisory Given   Pulmonary neg pulmonary ROS,  breath sounds clear to auscultation        Cardiovascular hypertension, Rhythm:Regular Rate:Normal     Neuro/Psych    GI/Hepatic Neg liver ROS, GERD-  ,  Endo/Other  negative endocrine ROS  Renal/GU negative Renal ROS     Musculoskeletal   Abdominal   Peds  Hematology   Anesthesia Other Findings   Reproductive/Obstetrics                          Anesthesia Physical Anesthesia Plan  ASA: II  Anesthesia Plan: General   Post-op Pain Management:    Induction: Intravenous  Airway Management Planned: Oral ETT  Additional Equipment:   Intra-op Plan:   Post-operative Plan: Extubation in OR  Informed Consent: I have reviewed the patients History and Physical, chart, labs and discussed the procedure including the risks, benefits and alternatives for the proposed anesthesia with the patient or authorized representative who has indicated his/her understanding and acceptance.   Dental advisory given  Plan Discussed with: CRNA and Surgeon  Anesthesia Plan Comments:         Anesthesia Quick Evaluation

## 2011-11-22 ENCOUNTER — Encounter (HOSPITAL_COMMUNITY): Payer: Self-pay | Admitting: Surgery

## 2011-11-22 LAB — COMPREHENSIVE METABOLIC PANEL WITH GFR
ALT: 43 U/L (ref 0–53)
AST: 39 U/L — ABNORMAL HIGH (ref 0–37)
Albumin: 2.8 g/dL — ABNORMAL LOW (ref 3.5–5.2)
Alkaline Phosphatase: 78 U/L (ref 39–117)
BUN: 9 mg/dL (ref 6–23)
CO2: 24 meq/L (ref 19–32)
Calcium: 8.8 mg/dL (ref 8.4–10.5)
Chloride: 103 meq/L (ref 96–112)
Creatinine, Ser: 0.77 mg/dL (ref 0.50–1.35)
GFR calc Af Amer: >90 mL/min
GFR calc non Af Amer: >90 mL/min
Glucose, Bld: 116 mg/dL — ABNORMAL HIGH (ref 70–99)
Potassium: 4.3 meq/L (ref 3.5–5.1)
Sodium: 135 meq/L (ref 135–145)
Total Bilirubin: 0.4 mg/dL (ref 0.3–1.2)
Total Protein: 6 g/dL (ref 6.0–8.3)

## 2011-11-22 LAB — CBC
HCT: 35.4 % — ABNORMAL LOW (ref 39.0–52.0)
Hemoglobin: 12.5 g/dL — ABNORMAL LOW (ref 13.0–17.0)
MCH: 32.7 pg (ref 26.0–34.0)
MCHC: 35.3 g/dL (ref 30.0–36.0)
MCV: 92.7 fL (ref 78.0–100.0)
Platelets: 165 K/uL (ref 150–400)
RBC: 3.82 MIL/uL — ABNORMAL LOW (ref 4.22–5.81)
RDW: 13.4 % (ref 11.5–15.5)
WBC: 18.3 K/uL — ABNORMAL HIGH (ref 4.0–10.5)

## 2011-11-23 MED ORDER — PANTOPRAZOLE SODIUM 40 MG PO TBEC: 40.0000 mg | DELAYED_RELEASE_TABLET | Freq: Every day | ORAL | Status: DC

## 2011-11-23 MED ORDER — OXYCODONE-ACETAMINOPHEN 5-325 MG PO TABS: 1.0000 | ORAL_TABLET | ORAL | Status: AC | PRN

## 2011-11-23 MED ORDER — AMOXICILLIN-POT CLAVULANATE 875-125 MG PO TABS: 1.0000 | ORAL_TABLET | Freq: Two times a day (BID) | ORAL | Status: AC

## 2011-11-23 NOTE — Progress Notes (Signed)
1 Day post op Subjective: Eating breakfast and wants to go home. Pain well controlled  Objective: Vital signs in last 24 hours: Temp:  [97.5 F (36.4 C)-98.5 F (36.9 C)] 97.5 F (36.4 C) (06/22 0510) Pulse Rate:  [56-72] 56  (06/22 0510) Resp:  [16-19] 19  (06/22 0510) BP: (96-120)/(59-62) 102/61 mmHg (06/22 0510) SpO2:  [94 %-99 %] 99 % (06/22 0510)   Intake/Output from previous day: 06/21 0701 - 06/22 0700 In: 1100 [I.V.:1000; IV Piggyback:100] Out: 1065 [Urine:1025; Drains:40] Intake/Output this shift: Total I/O In: 100 [IV Piggyback:100] Out: 1065 [Urine:1025; Drains:40]   General appearance: alert, cooperative and no distress Resp: clear to auscultation bilaterally GI: soft, slt distended minimal tender  Incision: healing well, JP thin red  Lab Results:   Basename 11/22/11 0615 11/21/11 0635  WBC 18.3* 16.1*  HGB 12.5* 14.7  HCT 35.4* 41.1  PLT 165 202   BMET  Basename 11/22/11 0615 11/21/11 0635  NA 135 134*  K 4.3 4.1  CL 103 100  CO2 24 23  GLUCOSE 116* 104*  BUN 9 7  CREATININE 0.77 0.76  CALCIUM 8.8 9.2   PT/INR No results found for this basename: LABPROT:2,INR:2 in the last 72 hours ABG No results found for this basename: PHART:2,PCO2:2,PO2:2,HCO3:2 in the last 72 hours  MEDS, Scheduled    . amLODipine  5 mg Oral Daily  . ertapenem (INVANZ) IV  1 g Intravenous Q24H  . lisinopril  10 mg Oral Daily  . pantoprazole (PROTONIX) IV  40 mg Intravenous QHS    Studies/Results: Dg Cholangiogram Operative  11/21/2011  *RADIOLOGY REPORT*  Clinical Data:   Microscopic cholecystectomy.  INTRAOPERATIVE CHOLANGIOGRAM  Technique:  Cholangiographic images from the C-arm fluoroscopic device were submitted for interpretation post-operatively.  Please see the procedural report for the amount of contrast and the fluoroscopy time utilized.  Comparison:  Abdominal ultrasound 11/20/2011.  Findings:  Single image from intraoperative cholangiogram is provided.  No  obvious filling defect or stricture is identified. Contrast is seen within the duodenum.  There is some spill of contrast material at the injection site.  IMPRESSION: Negative intraoperative cholangiogram.  Original Report Authenticated By: Bernadene Bell. Maricela Curet, M.D.    Assessment: s/p Procedure(s): LAPAROSCOPIC CHOLECYSTECTOMY WITH INTRAOPERATIVE CHOLANGIOGRAM Stable one day post op  Plan: Will contintue antibiotics and if he does well today can be discharges on Saturday. Note that I saw the patient X2 on Friday, but did not do note until Saturday AM   LOS: 3 days     Currie Paris, MD, [facility], [place] [phone]   11/23/2011 6:25 AM

## 2011-11-23 NOTE — Discharge Summary (Signed)
Physician Discharge Summary  Patient ID: Jeremy Rios MRN: [id number] DOB/AGE: 1956/02/11 56 y.o.  Admit date: 11/20/2011 Discharge date: 11/23/2011  Admission Diagnoses: acute cholecystitis  Discharge Diagnoses: same Principal Problem:  *Acute cholecystitis   Discharged Condition: good  Hospital Course: pt underwent lap chole IOC for acute cholecystitis with drain placement.  Did very well post op.  Good pain control and tolerated diet.  D/C home POD 2 in good condition.  Consults: None  Significant Diagnostic Studies: see H/P  Treatments: surgery: LAP CHOLE AND IOC  Discharge Exam: Blood pressure 120/80, pulse 56, temperature 97.5 F (36.4 C), temperature source Oral, resp. rate 19, height 5\' 7"  (1.702 m), weight 180 lb 2.2 oz (81.71 kg), SpO2 99.00%. Incision/Wound:CLEAN DRY INTACT.  JP SEROUS  Disposition:   Discharge Orders    Future Orders Please Complete By Expires   Diet - low sodium heart healthy      Increase activity slowly      Discharge instructions      Comments:   Ok to shower.  Empty drain daily and record output.     Medication List  As of 11/23/2011 11:18 AM   TAKE these medications         amLODipine 5 MG tablet   Commonly known as: NORVASC   Take 5 mg by mouth daily.      amoxicillin-clavulanate 875-125 MG per tablet   Commonly known as: AUGMENTIN   Take 1 tablet by mouth 2 (two) times daily.      aspirin 81 MG tablet   Take 81 mg by mouth daily.      calcium carbonate 500 MG chewable tablet   Commonly known as: TUMS - dosed in mg elemental calcium   Chew 4 tablets by mouth once.      lisinopril 10 MG tablet   Commonly known as: PRINIVIL,ZESTRIL   Take 10 mg by mouth daily.      oxyCODONE-acetaminophen 5-325 MG per tablet   Commonly known as: PERCOCET   Take 1-2 tablets by mouth every 4 (four) hours as needed.             Signed: Areej Tayler A. 11/23/2011, 11:18 AM

## 2011-11-23 NOTE — Discharge Instructions (Signed)
Office to call you Monday to return to clinic end of the week.   [facility] ______[facility] LAPAROSCOPIC SURGERY: POST OP INSTRUCTIONS Always review your discharge instruction sheet given to you by the facility where your surgery was performed. IF YOU HAVE DISABILITY OR FAMILY LEAVE FORMS, YOU MUST BRING THEM TO THE OFFICE FOR PROCESSING.   DO NOT GIVE THEM TO YOUR DOCTOR.  1. A prescription for pain medication may be given to you upon discharge.  Take your pain medication as prescribed, if needed.  If narcotic pain medicine is not needed, then you may take acetaminophen (Tylenol) or ibuprofen (Advil) as needed. 2. Take your usually prescribed medications unless otherwise directed. 3. If you need a refill on your pain medication, please contact your pharmacy.  They will contact our office to request authorization. Prescriptions will not be filled after 5pm or on week-ends. 4. You should follow a light diet the first few days after arrival home, such as soup and crackers, etc.  Be sure to include lots of fluids daily. 5. Most patients will experience some swelling and bruising in the area of the incisions.  Ice packs will help.  Swelling and bruising can take several days to resolve.  6. It is common to experience some constipation if taking pain medication after surgery.  Increasing fluid intake and taking a stool softener (such as Colace) will usually help or prevent this problem from occurring.  A mild laxative (Milk of Magnesia or Miralax) should be taken according to package instructions if there are no bowel movements after 48 hours. 7. Unless discharge instructions indicate otherwise, you may remove your bandages 24-48 hours after surgery, and you may shower at that time.  You may have steri-strips (small skin tapes) in place directly over the incision.  These strips should be left on the skin for 7-10 days.  If your surgeon used skin glue on the incision, you may shower in 24  hours.  The glue will flake off over the next 2-3 weeks.  Any sutures or staples will be removed at the office during your follow-up visit. 8. ACTIVITIES:  You may resume regular (light) daily activities beginning the next day--such as daily self-care, walking, climbing stairs--gradually increasing activities as tolerated.  You may have sexual intercourse when it is comfortable.  Refrain from any heavy lifting or straining until approved by your doctor. a. You may drive when you are no longer taking prescription pain medication, you can comfortably wear a seatbelt, and you can safely maneuver your car and apply brakes. b. RETURN TO WORK:  __________________________________________________________ 9. You should see your doctor in the office for a follow-up appointment approximately 2-3 weeks after your surgery.  Make sure that you call for this appointment within a day or two after you arrive home to insure a convenient appointment time. 10. OTHER INSTRUCTIONS: __________________________________________________________________________________________________________________________ __________________________________________________________________________________________________________________________ WHEN TO CALL YOUR DOCTOR: 1. Fever over 101.0 2. Inability to urinate 3. Continued bleeding from incision. 4. Increased pain, redness, or drainage from the incision. 5. Increasing abdominal pain  The clinic staff is available to answer your questions during regular business hours.  Please don't hesitate to call and ask to speak to one of the nurses for clinical concerns.  If you have a medical emergency, go to the nearest emergency room or call 911.  A surgeon from [facility] is always on call at the hospital. [address], [place], [place]  [address] ? [id number],  [place], [place]   [address] [phone] ? [phone] ? FAX [phone] Web site:  [email or web address]

## 2011-11-28 ENCOUNTER — Encounter (INDEPENDENT_AMBULATORY_CARE_PROVIDER_SITE_OTHER): Payer: Self-pay | Admitting: General Surgery

## 2011-11-28 ENCOUNTER — Ambulatory Visit (INDEPENDENT_AMBULATORY_CARE_PROVIDER_SITE_OTHER): Payer: BC Managed Care – PPO | Admitting: General Surgery

## 2011-11-28 VITALS — BP 130/78 | HR 64 | Temp 98.3°F | Resp 14 | Ht 66.0 in | Wt 177.4 lb

## 2011-11-28 DIAGNOSIS — K810 Acute cholecystitis: Secondary | ICD-10-CM

## 2011-11-28 NOTE — Progress Notes (Signed)
HISTORY: Pt is s/p lap chole with IOC for gangrenous cholecystitis around 1 week ago by Dr. Jamey Ripa.  He is doing well other than feeling completely wiped out.  He has little appetite.  He denies fevers/ chills/nausea/vomiting.  He has taken minimal narcotics in the last 4 days.  His drain output has come down to 10-15 mL/day.      EXAM: General:  Alert and oriented x 3 Incision:  Well healed.  Drain output with old blood.     PATHOLOGY: Gallbladder - ACUTE SUPPURATIVE CHOLECYSTITIS. - CHOLELITHIASIS.   ASSESSMENT AND PLAN:   Acute cholecystitis No signs/symptoms of post cholecystectomy abscess or bile leak  Drain removed  Follow up as needed.         Maudry Diego, MD Surgical Oncology, General & Endocrine Surgery [facility]  No primary provider on file. No ref. provider found

## 2011-11-28 NOTE — Patient Instructions (Signed)
Follow up if fevers/ chills develop.  If you continue to have loose stools or if they become more frequent, call our office.

## 2011-11-28 NOTE — Assessment & Plan Note (Signed)
No signs/symptoms of post cholecystectomy abscess or bile leak  Drain removed  Follow up as needed.

## 2012-02-12 ENCOUNTER — Other Ambulatory Visit: Payer: Self-pay | Admitting: Gastroenterology

## 2013-03-26 ENCOUNTER — Ambulatory Visit (HOSPITAL_COMMUNITY)
Admission: RE | Admit: 2013-03-26 | Discharge: 2013-03-26 | Disposition: A | Discharge status: Home / Self Care | Payer: BC Managed Care – PPO | Source: Ambulatory Visit | Attending: Vascular Surgery | Admitting: Vascular Surgery

## 2013-03-26 ENCOUNTER — Encounter (INDEPENDENT_AMBULATORY_CARE_PROVIDER_SITE_OTHER): Payer: Self-pay

## 2013-03-26 ENCOUNTER — Other Ambulatory Visit (HOSPITAL_COMMUNITY): Payer: Self-pay | Admitting: Internal Medicine

## 2013-03-26 DIAGNOSIS — I6529 Occlusion and stenosis of unspecified carotid artery: Secondary | ICD-10-CM | POA: Insufficient documentation

## 2013-03-26 DIAGNOSIS — I658 Occlusion and stenosis of other precerebral arteries: Secondary | ICD-10-CM | POA: Insufficient documentation

## 2013-04-08 ENCOUNTER — Other Ambulatory Visit: Payer: Self-pay | Admitting: Dermatology

## 2013-12-01 ENCOUNTER — Telehealth (INDEPENDENT_AMBULATORY_CARE_PROVIDER_SITE_OTHER): Payer: Self-pay

## 2013-12-01 NOTE — Telephone Encounter (Signed)
Called and left message to call our office RE:  Rescheduled appointment to 12/02/13 @ 9:10am per Dr. Norva RiffleMartins request.

## 2013-12-02 ENCOUNTER — Encounter (INDEPENDENT_AMBULATORY_CARE_PROVIDER_SITE_OTHER): Payer: Self-pay | Admitting: Surgery

## 2013-12-02 ENCOUNTER — Ambulatory Visit (INDEPENDENT_AMBULATORY_CARE_PROVIDER_SITE_OTHER): Payer: BC Managed Care – PPO | Admitting: Surgery

## 2013-12-02 VITALS — BP 122/80 | HR 70 | Temp 97.5°F | Ht 66.0 in | Wt 180.0 lb

## 2013-12-02 DIAGNOSIS — N50819 Testicular pain, unspecified: Secondary | ICD-10-CM

## 2013-12-02 DIAGNOSIS — N509 Disorder of male genital organs, unspecified: Secondary | ICD-10-CM

## 2013-12-02 MED ORDER — DOXYCYCLINE HYCLATE 100 MG PO TABS: 100.0000 mg | ORAL_TABLET | Freq: Two times a day (BID) | ORAL | Status: DC

## 2013-12-02 NOTE — Progress Notes (Signed)
Chief Complaint:  One month of right groin pain radiating into the testicle  History of Present Illness:  Jeremy Rios is an 58 y.o. male who has had 4 weeks of waxing and waning pain in the right groin. The pain is in better after ejaculation in some cases goes away. He denies any physical or strenuous activity. He was seen by Dr. Brunilda PayorNesi yesterday he did not feel any evidence of a right inguinal hernia.  Past Medical History  Diagnosis Date  . Hypertension     Past Surgical History  Procedure Laterality Date  . Cervical disc surgery    . Cholecystectomy  11/21/2011    Procedure: LAPAROSCOPIC CHOLECYSTECTOMY WITH INTRAOPERATIVE CHOLANGIOGRAM;  Surgeon: Currie Parishristian J Streck, MD;  Location: [facility] OR;  Service: General;  Laterality: N/A;    Current Outpatient Prescriptions  Medication Sig Dispense Refill  . amLODipine (NORVASC) 5 MG tablet Take 5 mg by mouth daily.      Marland Kitchen. aspirin 81 MG tablet Take 81 mg by mouth daily.      Marland Kitchen. atorvastatin (LIPITOR) 10 MG tablet Take 10 mg by mouth daily.      Marland Kitchen. lisinopril (PRINIVIL,ZESTRIL) 10 MG tablet Take 10 mg by mouth daily.      Marland Kitchen. doxycycline (VIBRA-TABS) 100 MG tablet Take 1 tablet (100 mg total) by mouth 2 (two) times daily.  20 tablet  2   No current facility-administered medications for this visit.   Review of patient's allergies indicates no known allergies. Family History  Problem Relation Age of Onset  . Cancer Mother     colon  . Heart disease Father    Social History:   reports that he has been smoking.  He does not have any smokeless tobacco history on file. He reports that he drinks alcohol. He reports that he does not use illicit drugs.   REVIEW OF SYSTEMS : Positive for history of colon cancer in his mother. PSA is reported as normal ; otherwise negative  Physical Exam:   Blood pressure 122/80, pulse 70, temperature 97.5 F (36.4 C), height 5\' 6"  (1.676 m), weight 180 lb (81.647 kg). Body mass index is 29.07 kg/(m^2).  Gen:  WDWN  white male NAD  Neurological: Alert and oriented to person, place, and time. Motor and sensory function is grossly intact  Head: Normocephalic and atraumatic.  Eyes: Conjunctivae are normal. Pupils are equal, round, and reactive to light. No scleral icterus.  Neck: Normal range of motion. Neck supple.  GU:  Testes without masses. The right epididymis is tender in the left is not. I do not feel a palpable defect on the right side suggestive of a hernia with cough or Valsalva. There may be a small hernia on the left side but this is not definite. Skin: Skin is warm and dry. No rash noted. No diaphoresis. No erythema. No pallor. Pscyh: Normal mood and affect. Behavior is normal. Judgment and thought content normal.   LABORATORY RESULTS: No results found for this or any previous visit (from the past 48 hour(s)).   RADIOLOGY RESULTS: No results found.  Problem List: Patient Active Problem List   Diagnosis Date Noted  . Acute cholecystitis 11/20/2011    Assessment & Plan: With a history and findings I felt that some chronic epididymitis might explain his discomfort. I will treat him with doxycycline for 10 days. We'll discuss with Dr. Brunilda PayorNesi when he is available.    Matt B. Daphine DeutscherMartin, MD, [facility]  [facility] [phone] beeper  [phone]  12/02/2013 10:25 AM

## 2013-12-02 NOTE — Patient Instructions (Signed)
Epididymitis °Epididymitis is a swelling (inflammation) of the epididymis. The epididymis is a cord-like structure along the back part of the testicle. Epididymitis is usually, but not always, caused by infection. This is usually a sudden problem beginning with chills, fever and pain behind the scrotum and in the testicle. There may be swelling and redness of the testicle. °DIAGNOSIS  °Physical examination will reveal a tender, swollen epididymis. Sometimes, cultures are obtained from the urine or from prostate secretions to help find out if there is an infection or if the cause is a different problem. Sometimes, blood work is performed to see if your white blood cell count is elevated and if a germ (bacterial) or viral infection is present. Using this knowledge, an appropriate medicine which kills germs (antibiotic) can be chosen by your caregiver. A viral infection causing epididymitis will most often go away (resolve) without treatment. °HOME CARE INSTRUCTIONS  °· Hot sitz baths for 20 minutes, 4 times per day, may help relieve pain. °· Only take over-the-counter or prescription medicines for pain, discomfort or fever as directed by your caregiver. °· Take all medicines, including antibiotics, as directed. Take the antibiotics for the full prescribed length of time even if you are feeling better. °· It is very important to keep all follow-up appointments. °SEEK IMMEDIATE MEDICAL CARE IF:  °· You have a fever. °· You have pain not relieved with medicines. °· You have any worsening of your problems. °· Your pain seems to come and go. °· You develop pain, redness, and swelling in the scrotum and surrounding areas. °MAKE SURE YOU:  °· Understand these instructions. °· Will watch your condition. °· Will get help right away if you are not doing well or get worse. °Document Released: 05/17/2000 Document Revised: 08/12/2011 Document Reviewed: 04/06/2009 °ExitCare® Patient Information ©2015 [employer]. This information  is not intended to replace advice given to you by your health care provider. Make sure you discuss any questions you have with your health care provider. ° °

## 2013-12-24 ENCOUNTER — Encounter (INDEPENDENT_AMBULATORY_CARE_PROVIDER_SITE_OTHER): Payer: BC Managed Care – PPO | Admitting: Surgery

## 2014-02-03 ENCOUNTER — Encounter (INDEPENDENT_AMBULATORY_CARE_PROVIDER_SITE_OTHER): Payer: BC Managed Care – PPO | Admitting: Surgery

## 2014-08-18 ENCOUNTER — Other Ambulatory Visit: Payer: Self-pay | Admitting: Internal Medicine

## 2014-08-18 DIAGNOSIS — Z720 Tobacco use: Secondary | ICD-10-CM

## 2014-08-29 ENCOUNTER — Ambulatory Visit
Admission: RE | Admit: 2014-08-29 | Discharge: 2014-08-29 | Disposition: A | Discharge status: Home / Self Care | Payer: No Typology Code available for payment source | Source: Ambulatory Visit | Attending: Internal Medicine | Admitting: Internal Medicine

## 2014-08-29 DIAGNOSIS — Z720 Tobacco use: Secondary | ICD-10-CM

## 2014-12-10 ENCOUNTER — Ambulatory Visit (INDEPENDENT_AMBULATORY_CARE_PROVIDER_SITE_OTHER): Payer: BLUE CROSS/BLUE SHIELD | Admitting: Family Medicine

## 2014-12-10 VITALS — BP 158/72 | HR 79 | Temp 98.1°F | Resp 18 | Ht 65.5 in | Wt 188.2 lb

## 2014-12-10 DIAGNOSIS — L0591 Pilonidal cyst without abscess: Secondary | ICD-10-CM | POA: Diagnosis not present

## 2014-12-10 MED ORDER — DOXYCYCLINE HYCLATE 100 MG PO TABS: 100.0000 mg | ORAL_TABLET | Freq: Two times a day (BID) | ORAL | Status: DC

## 2014-12-10 NOTE — Patient Instructions (Signed)
Your infected cyst has already drained- this is the most important step towards healing Do warm soaks 2-3x a day for the next several days and squeeze the area gently to help any additional pus drain If the area is not continuing to get better over the next few days please come back! Use the doxcycyline antibiotic twice a day for 10 days- watch out for the sun with this medication!

## 2014-12-10 NOTE — Progress Notes (Signed)
[facility] [address], [place] [id number] [phone]  Date:  12/10/2014   Name:  Jeremy Rios   DOB:  15-Apr-1956   MRN:  [id number]  PCP:  Darnelle BosSBORNE,JAMES CHARLES, MD    Chief Complaint: Cyst   History of Present Illness:  Jeremy Rios is a 59 y.o. very pleasant male patient who presents with the following:  He notes a painful area near his anus- noted last night. It feels like an inflamed area about the size of a pea.  Does not seems like a hemorrhoid to him- he has had these in the past.  He sits a lot for work and is not sure if this contributed He does tend to get cysts, but never had this exact problem in the past He is traveling tomorrow and wants to make sure he is ok He is generally in good health, OW feels well  Patient Active Problem List   Diagnosis Date Noted  . Acute cholecystitis 11/20/2011    Past Medical History  Diagnosis Date  . Hypertension     Past Surgical History  Procedure Laterality Date  . Cervical disc surgery    . Cholecystectomy  11/21/2011    Procedure: LAPAROSCOPIC CHOLECYSTECTOMY WITH INTRAOPERATIVE CHOLANGIOGRAM;  Surgeon: Currie Parishristian J Streck, MD;  Location: [facility] OR;  Service: General;  Laterality: N/A;  . Spine surgery    . Hand surgery      History  Substance Use Topics  . Smoking status: Former Smoker -- 1.00 packs/day  . Smokeless tobacco: Not on file  . Alcohol Use: 0.0 oz/week    0 Standard drinks or equivalent per week    Family History  Problem Relation Age of Onset  . Cancer Mother     colon  . Heart disease Father   . Diabetes Father   . Hypertension Father   . Hypertension Sister   . Heart disease Brother   . Hypertension Brother     No Known Allergies  Medication list has been reviewed and updated.  Current Outpatient Prescriptions on File Prior to Visit  Medication Sig Dispense Refill  . aspirin 81 MG tablet Take 81 mg by mouth daily.    Marland Kitchen. atorvastatin (LIPITOR) 10 MG tablet Take  10 mg by mouth daily.    Marland Kitchen. doxycycline (VIBRA-TABS) 100 MG tablet Take 1 tablet (100 mg total) by mouth 2 (two) times daily. 20 tablet 2  . amLODipine (NORVASC) 5 MG tablet Take 5 mg by mouth daily.    Marland Kitchen. lisinopril (PRINIVIL,ZESTRIL) 10 MG tablet Take 10 mg by mouth daily.     No current facility-administered medications on file prior to visit.    Review of Systems:  As per HPI- otherwise negative.   Physical Examination: Filed Vitals:   12/10/14 1406  BP: 158/72  Pulse: 79  Temp: 98.1 F (36.7 C)  Resp: 18   Filed Vitals:   12/10/14 1406  Height: 5' 5.5" (1.664 m)  Weight: 188 lb 3.2 oz (85.367 kg)   Body mass index is 30.83 kg/(m^2). Ideal Body Weight: Weight in (lb) to have BMI = 25: 152.2  GEN: WDWN, NAD, Non-toxic, A & O x 3, looks well HEENT: Atraumatic, Normocephalic. Neck supple. No masses, No LAD. Ears and Nose: No external deformity. CV: RRR, No M/G/R. No JVD. No thrill. No extra heart sounds. PULM: CTA B, no wheezes, crackles, rhonchi. No retractions. No resp. distress. No accessory muscle use. EXTR: No c/c/e NEURO Normal gait.  PSYCH: Normally interactive. Conversant. Not depressed or anxious appearing.  Calm demeanor.  perineurm- there is a tiny furuncle anterior to the anus that is already draining pus.  Squeezed and expressed the rest of the pus.  The immediate area is tender but there is no fluctuance or other swelling, no need for I and D   Assessment and Plan: Infected pilonidal cyst - Plan: doxycycline (VIBRA-TABS) 100 MG tablet, DISCONTINUED: doxycycline (VIBRA-TABS) 100 MG tablet  Self- drained.  Start doxy and sits baths, follow-up if not continuing to improve  Signed Abbe Amsterdam, MD

## 2015-08-22 ENCOUNTER — Other Ambulatory Visit: Payer: Self-pay | Admitting: Acute Care

## 2015-08-22 DIAGNOSIS — Z87891 Personal history of nicotine dependence: Secondary | ICD-10-CM

## 2015-10-10 DIAGNOSIS — Z0000 Encounter for general adult medical examination without abnormal findings: Secondary | ICD-10-CM | POA: Diagnosis not present

## 2015-10-10 DIAGNOSIS — R7301 Impaired fasting glucose: Secondary | ICD-10-CM | POA: Diagnosis not present

## 2015-10-10 DIAGNOSIS — E538 Deficiency of other specified B group vitamins: Secondary | ICD-10-CM | POA: Diagnosis not present

## 2015-10-10 DIAGNOSIS — Z125 Encounter for screening for malignant neoplasm of prostate: Secondary | ICD-10-CM | POA: Diagnosis not present

## 2015-10-17 DIAGNOSIS — Z1389 Encounter for screening for other disorder: Secondary | ICD-10-CM | POA: Diagnosis not present

## 2015-10-17 DIAGNOSIS — I7090 Unspecified atherosclerosis: Secondary | ICD-10-CM | POA: Diagnosis not present

## 2015-10-17 DIAGNOSIS — R918 Other nonspecific abnormal finding of lung field: Secondary | ICD-10-CM | POA: Diagnosis not present

## 2015-10-17 DIAGNOSIS — Z0000 Encounter for general adult medical examination without abnormal findings: Secondary | ICD-10-CM | POA: Diagnosis not present

## 2015-10-17 DIAGNOSIS — E538 Deficiency of other specified B group vitamins: Secondary | ICD-10-CM | POA: Diagnosis not present

## 2015-10-17 DIAGNOSIS — Z23 Encounter for immunization: Secondary | ICD-10-CM | POA: Diagnosis not present

## 2015-10-17 DIAGNOSIS — D538 Other specified nutritional anemias: Secondary | ICD-10-CM | POA: Diagnosis not present

## 2015-10-17 DIAGNOSIS — Z1212 Encounter for screening for malignant neoplasm of rectum: Secondary | ICD-10-CM | POA: Diagnosis not present

## 2016-02-26 DIAGNOSIS — L570 Actinic keratosis: Secondary | ICD-10-CM | POA: Diagnosis not present

## 2016-04-23 DIAGNOSIS — Z23 Encounter for immunization: Secondary | ICD-10-CM | POA: Diagnosis not present

## 2016-08-19 DIAGNOSIS — L570 Actinic keratosis: Secondary | ICD-10-CM | POA: Diagnosis not present

## 2016-10-22 DIAGNOSIS — R52 Pain, unspecified: Secondary | ICD-10-CM | POA: Diagnosis not present

## 2016-10-22 DIAGNOSIS — J208 Acute bronchitis due to other specified organisms: Secondary | ICD-10-CM | POA: Diagnosis not present

## 2016-10-22 DIAGNOSIS — R05 Cough: Secondary | ICD-10-CM | POA: Diagnosis not present

## 2016-10-22 DIAGNOSIS — J028 Acute pharyngitis due to other specified organisms: Secondary | ICD-10-CM | POA: Diagnosis not present

## 2016-11-15 DIAGNOSIS — R7301 Impaired fasting glucose: Secondary | ICD-10-CM | POA: Diagnosis not present

## 2016-11-15 DIAGNOSIS — I10 Essential (primary) hypertension: Secondary | ICD-10-CM | POA: Diagnosis not present

## 2016-11-15 DIAGNOSIS — E538 Deficiency of other specified B group vitamins: Secondary | ICD-10-CM | POA: Diagnosis not present

## 2016-11-15 DIAGNOSIS — Z125 Encounter for screening for malignant neoplasm of prostate: Secondary | ICD-10-CM | POA: Diagnosis not present

## 2016-11-22 ENCOUNTER — Other Ambulatory Visit: Payer: Self-pay | Admitting: Internal Medicine

## 2016-11-22 DIAGNOSIS — R911 Solitary pulmonary nodule: Secondary | ICD-10-CM

## 2016-11-22 DIAGNOSIS — I7090 Unspecified atherosclerosis: Secondary | ICD-10-CM | POA: Diagnosis not present

## 2016-11-22 DIAGNOSIS — E785 Hyperlipidemia, unspecified: Secondary | ICD-10-CM | POA: Diagnosis not present

## 2016-11-22 DIAGNOSIS — D538 Other specified nutritional anemias: Secondary | ICD-10-CM | POA: Diagnosis not present

## 2016-11-22 DIAGNOSIS — Z1389 Encounter for screening for other disorder: Secondary | ICD-10-CM | POA: Diagnosis not present

## 2016-11-22 DIAGNOSIS — E538 Deficiency of other specified B group vitamins: Secondary | ICD-10-CM | POA: Diagnosis not present

## 2016-11-22 DIAGNOSIS — Z0000 Encounter for general adult medical examination without abnormal findings: Secondary | ICD-10-CM | POA: Diagnosis not present

## 2016-11-22 DIAGNOSIS — Z1212 Encounter for screening for malignant neoplasm of rectum: Secondary | ICD-10-CM | POA: Diagnosis not present

## 2016-11-22 DIAGNOSIS — R918 Other nonspecific abnormal finding of lung field: Secondary | ICD-10-CM | POA: Diagnosis not present

## 2016-11-22 DIAGNOSIS — I10 Essential (primary) hypertension: Secondary | ICD-10-CM | POA: Diagnosis not present

## 2016-11-25 ENCOUNTER — Other Ambulatory Visit (HOSPITAL_COMMUNITY): Payer: Self-pay | Admitting: Internal Medicine

## 2016-11-25 DIAGNOSIS — R0989 Other specified symptoms and signs involving the circulatory and respiratory systems: Secondary | ICD-10-CM

## 2016-11-26 ENCOUNTER — Inpatient Hospital Stay (HOSPITAL_COMMUNITY): Admission: RE | Admit: 2016-11-26 | Payer: Self-pay | Source: Ambulatory Visit

## 2016-11-28 ENCOUNTER — Ambulatory Visit
Admission: RE | Admit: 2016-11-28 | Discharge: 2016-11-28 | Disposition: A | Discharge status: Home / Self Care | Payer: BLUE CROSS/BLUE SHIELD | Source: Ambulatory Visit | Attending: Internal Medicine | Admitting: Internal Medicine

## 2016-11-28 DIAGNOSIS — R911 Solitary pulmonary nodule: Secondary | ICD-10-CM

## 2016-12-09 ENCOUNTER — Ambulatory Visit (HOSPITAL_COMMUNITY)
Admission: RE | Admit: 2016-12-09 | Discharge: 2016-12-09 | Disposition: A | Discharge status: Home / Self Care | Payer: BLUE CROSS/BLUE SHIELD | Source: Ambulatory Visit | Attending: Internal Medicine | Admitting: Internal Medicine

## 2016-12-09 DIAGNOSIS — R0989 Other specified symptoms and signs involving the circulatory and respiratory systems: Secondary | ICD-10-CM | POA: Diagnosis not present

## 2016-12-09 DIAGNOSIS — I6523 Occlusion and stenosis of bilateral carotid arteries: Secondary | ICD-10-CM | POA: Insufficient documentation

## 2017-01-03 DIAGNOSIS — H2513 Age-related nuclear cataract, bilateral: Secondary | ICD-10-CM | POA: Diagnosis not present

## 2017-03-03 DIAGNOSIS — D1801 Hemangioma of skin and subcutaneous tissue: Secondary | ICD-10-CM | POA: Diagnosis not present

## 2017-03-03 DIAGNOSIS — L564 Polymorphous light eruption: Secondary | ICD-10-CM | POA: Diagnosis not present

## 2017-03-03 DIAGNOSIS — L570 Actinic keratosis: Secondary | ICD-10-CM | POA: Diagnosis not present

## 2017-03-03 DIAGNOSIS — L812 Freckles: Secondary | ICD-10-CM | POA: Diagnosis not present

## 2017-04-14 DIAGNOSIS — Z86010 Personal history of colonic polyps: Secondary | ICD-10-CM | POA: Diagnosis not present

## 2017-04-14 DIAGNOSIS — K635 Polyp of colon: Secondary | ICD-10-CM | POA: Diagnosis not present

## 2017-04-14 DIAGNOSIS — K621 Rectal polyp: Secondary | ICD-10-CM | POA: Diagnosis not present

## 2017-04-17 DIAGNOSIS — K635 Polyp of colon: Secondary | ICD-10-CM | POA: Diagnosis not present

## 2017-05-06 DIAGNOSIS — Z23 Encounter for immunization: Secondary | ICD-10-CM | POA: Diagnosis not present

## 2017-08-20 DIAGNOSIS — Z6832 Body mass index (BMI) 32.0-32.9, adult: Secondary | ICD-10-CM | POA: Diagnosis not present

## 2017-08-20 DIAGNOSIS — H6982 Other specified disorders of Eustachian tube, left ear: Secondary | ICD-10-CM | POA: Diagnosis not present

## 2017-09-16 DIAGNOSIS — L812 Freckles: Secondary | ICD-10-CM | POA: Diagnosis not present

## 2017-09-16 DIAGNOSIS — L570 Actinic keratosis: Secondary | ICD-10-CM | POA: Diagnosis not present

## 2017-09-16 DIAGNOSIS — D1801 Hemangioma of skin and subcutaneous tissue: Secondary | ICD-10-CM | POA: Diagnosis not present

## 2017-09-17 DIAGNOSIS — H903 Sensorineural hearing loss, bilateral: Secondary | ICD-10-CM | POA: Diagnosis not present

## 2017-09-17 DIAGNOSIS — H6982 Other specified disorders of Eustachian tube, left ear: Secondary | ICD-10-CM | POA: Diagnosis not present

## 2017-12-18 DIAGNOSIS — L738 Other specified follicular disorders: Secondary | ICD-10-CM | POA: Diagnosis not present

## 2017-12-18 DIAGNOSIS — B353 Tinea pedis: Secondary | ICD-10-CM | POA: Diagnosis not present

## 2017-12-18 DIAGNOSIS — L237 Allergic contact dermatitis due to plants, except food: Secondary | ICD-10-CM | POA: Diagnosis not present

## 2018-01-06 DIAGNOSIS — Z0000 Encounter for general adult medical examination without abnormal findings: Secondary | ICD-10-CM | POA: Diagnosis not present

## 2018-01-06 DIAGNOSIS — Z125 Encounter for screening for malignant neoplasm of prostate: Secondary | ICD-10-CM | POA: Diagnosis not present

## 2018-01-06 DIAGNOSIS — R82998 Other abnormal findings in urine: Secondary | ICD-10-CM | POA: Diagnosis not present

## 2018-01-06 DIAGNOSIS — I10 Essential (primary) hypertension: Secondary | ICD-10-CM | POA: Diagnosis not present

## 2018-01-06 DIAGNOSIS — E538 Deficiency of other specified B group vitamins: Secondary | ICD-10-CM | POA: Diagnosis not present

## 2018-01-13 DIAGNOSIS — I10 Essential (primary) hypertension: Secondary | ICD-10-CM | POA: Diagnosis not present

## 2018-01-13 DIAGNOSIS — R7301 Impaired fasting glucose: Secondary | ICD-10-CM | POA: Diagnosis not present

## 2018-01-13 DIAGNOSIS — H6982 Other specified disorders of Eustachian tube, left ear: Secondary | ICD-10-CM | POA: Diagnosis not present

## 2018-01-13 DIAGNOSIS — Z1389 Encounter for screening for other disorder: Secondary | ICD-10-CM | POA: Diagnosis not present

## 2018-01-13 DIAGNOSIS — I7090 Unspecified atherosclerosis: Secondary | ICD-10-CM | POA: Diagnosis not present

## 2018-01-13 DIAGNOSIS — Z0000 Encounter for general adult medical examination without abnormal findings: Secondary | ICD-10-CM | POA: Diagnosis not present

## 2018-01-15 ENCOUNTER — Other Ambulatory Visit: Payer: Self-pay | Admitting: Internal Medicine

## 2018-01-15 DIAGNOSIS — R911 Solitary pulmonary nodule: Secondary | ICD-10-CM

## 2018-01-26 ENCOUNTER — Ambulatory Visit
Admission: RE | Admit: 2018-01-26 | Discharge: 2018-01-26 | Disposition: A | Discharge status: Home / Self Care | Payer: BLUE CROSS/BLUE SHIELD | Source: Ambulatory Visit | Attending: Internal Medicine | Admitting: Internal Medicine

## 2018-01-26 DIAGNOSIS — R911 Solitary pulmonary nodule: Secondary | ICD-10-CM

## 2018-01-26 DIAGNOSIS — Z87891 Personal history of nicotine dependence: Secondary | ICD-10-CM | POA: Diagnosis not present

## 2018-02-07 ENCOUNTER — Encounter (HOSPITAL_COMMUNITY): Payer: Self-pay | Admitting: Internal Medicine

## 2018-02-07 ENCOUNTER — Emergency Department (HOSPITAL_COMMUNITY): Payer: BLUE CROSS/BLUE SHIELD

## 2018-02-07 ENCOUNTER — Emergency Department (HOSPITAL_COMMUNITY)
Admission: EM | Admit: 2018-02-07 | Discharge: 2018-02-07 | Disposition: A | Discharge status: Home / Self Care | Payer: BLUE CROSS/BLUE SHIELD | Attending: Emergency Medicine | Admitting: Emergency Medicine

## 2018-02-07 DIAGNOSIS — R42 Dizziness and giddiness: Secondary | ICD-10-CM | POA: Diagnosis not present

## 2018-02-07 DIAGNOSIS — Z7982 Long term (current) use of aspirin: Secondary | ICD-10-CM | POA: Insufficient documentation

## 2018-02-07 DIAGNOSIS — I10 Essential (primary) hypertension: Secondary | ICD-10-CM | POA: Diagnosis not present

## 2018-02-07 DIAGNOSIS — Z87891 Personal history of nicotine dependence: Secondary | ICD-10-CM | POA: Insufficient documentation

## 2018-02-07 DIAGNOSIS — Z79899 Other long term (current) drug therapy: Secondary | ICD-10-CM | POA: Insufficient documentation

## 2018-02-07 DIAGNOSIS — R61 Generalized hyperhidrosis: Secondary | ICD-10-CM | POA: Diagnosis not present

## 2018-02-07 DIAGNOSIS — I499 Cardiac arrhythmia, unspecified: Secondary | ICD-10-CM | POA: Diagnosis not present

## 2018-02-07 DIAGNOSIS — I959 Hypotension, unspecified: Secondary | ICD-10-CM | POA: Diagnosis not present

## 2018-02-07 LAB — I-STAT TROPONIN, ED
Troponin i, poc: 0.02 ng/mL (ref 0.00–0.08)
Troponin i, poc: 0.03 ng/mL (ref 0.00–0.08)

## 2018-02-07 LAB — CBC WITH DIFFERENTIAL/PLATELET
Abs Immature Granulocytes: 0.1 K/uL (ref 0.0–0.1)
Basophils Absolute: 0.1 K/uL (ref 0.0–0.1)
Basophils Relative: 1 %
Eosinophils Absolute: 0.1 K/uL (ref 0.0–0.7)
Eosinophils Relative: 1 %
HCT: 39.7 % (ref 39.0–52.0)
Hemoglobin: 13.9 g/dL (ref 13.0–17.0)
Immature Granulocytes: 1 %
Lymphocytes Relative: 16 %
Lymphs Abs: 1.4 K/uL (ref 0.7–4.0)
MCH: 32.3 pg (ref 26.0–34.0)
MCHC: 35 g/dL (ref 30.0–36.0)
MCV: 92.1 fL (ref 78.0–100.0)
Monocytes Absolute: 0.5 K/uL (ref 0.1–1.0)
Monocytes Relative: 5 %
Neutro Abs: 6.8 K/uL (ref 1.7–7.7)
Neutrophils Relative %: 76 %
Platelets: 181 K/uL (ref 150–400)
RBC: 4.31 MIL/uL (ref 4.22–5.81)
RDW: 12.1 % (ref 11.5–15.5)
WBC: 8.8 K/uL (ref 4.0–10.5)

## 2018-02-07 LAB — BASIC METABOLIC PANEL WITH GFR
Anion gap: 10 (ref 5–15)
BUN: 11 mg/dL (ref 8–23)
CO2: 21 mmol/L — ABNORMAL LOW (ref 22–32)
Calcium: 9.2 mg/dL (ref 8.9–10.3)
Chloride: 108 mmol/L (ref 98–111)
Creatinine, Ser: 0.94 mg/dL (ref 0.61–1.24)
GFR calc Af Amer: >60 mL/min
GFR calc non Af Amer: >60 mL/min
Glucose, Bld: 132 mg/dL — ABNORMAL HIGH (ref 70–99)
Potassium: 3.9 mmol/L (ref 3.5–5.1)
Sodium: 139 mmol/L (ref 135–145)

## 2018-02-07 MED ORDER — SODIUM CHLORIDE 0.9 % IV BOLUS
1000.0000 mL | Freq: Once | INTRAVENOUS | Status: AC
  Administered 2018-02-07: 1000 mL via INTRAVENOUS

## 2018-02-07 MED ORDER — MECLIZINE HCL 25 MG PO TABS: 25.0000 mg | ORAL_TABLET | Freq: Three times a day (TID) | ORAL | 0 refills | Status: DC | PRN

## 2018-02-07 MED ORDER — MECLIZINE HCL 25 MG PO TABS
25.0000 mg | ORAL_TABLET | Freq: Once | ORAL | Status: AC
  Administered 2018-02-07: 25 mg via ORAL
  Filled 2018-02-07: qty 1

## 2018-02-07 MED ORDER — LORAZEPAM 2 MG/ML IJ SOLN
0.5000 mg | Freq: Once | INTRAMUSCULAR | Status: AC
  Administered 2018-02-07: 0.5 mg via INTRAVENOUS
  Filled 2018-02-07: qty 1

## 2018-02-07 NOTE — ED Provider Notes (Signed)
Patient care assumed from [facility], [place]. The patient presented for dizziness and was concerned it was due to a cardiac cause. Did have nausea and diaphoresis. EKG no STEMI, trops normal, meclizine, ativan. Able to get up and stand without dizziness. If CT head normal and delta troponin negative, then discharge. Established with ENT, and should follow up with ENT for further diagnostic work up of dizziness.  CT head shows no acute intracranial abnormalities. Delta troponin negative. Patient reports symptoms have resolved and ambulates without difficulty. Patient instructed to follow up with ENT. The patient reports understanding and agreement with plan and discharge precautions.  Patient care supervised by Dr. Hyacinth Meeker.   Nash Dimmer, MD    Nash Dimmer, MD 02/08/18 1443    Eber Hong, MD 02/09/18 [phone]

## 2018-02-07 NOTE — ED Provider Notes (Signed)
[facility] EMERGENCY DEPARTMENT Provider Note   CSN: [id number] Arrival date & time: 02/07/18  1216     History   Chief Complaint Chief Complaint  Patient presents with  . Dizziness    HPI Jeremy Rios is a 62 y.o. male who presents with dizziness. PMH significant for HTN, HLD. He states that he was sitting at his desk at work and had a sudden onset of severe dizziness like the room was spinning. He was very off balance but was able to walk. He also felt somewhat lightheaded. He had someone at his job call 911. He reports associated nausea and diaphoresis with the symptoms. He denies chest pain or SOB. He also denies recent illness, headache, neck pain, ear pain, vision changes, arm or leg weakness. No cardiac hx. No hx of stroke or vertigo. He is worried about a heart attack.  HPI  Past Medical History:  Diagnosis Date  . Hypertension     Patient Active Problem List   Diagnosis Date Noted  . Acute cholecystitis 11/20/2011    Past Surgical History:  Procedure Laterality Date  . CERVICAL DISC SURGERY    . CHOLECYSTECTOMY  11/21/2011   Procedure: LAPAROSCOPIC CHOLECYSTECTOMY WITH INTRAOPERATIVE CHOLANGIOGRAM;  Surgeon: Currie Paris, MD;  Location: [facility] OR;  Service: General;  Laterality: N/A;  . HAND SURGERY    . SPINE SURGERY          Home Medications    Prior to Admission medications   Medication Sig Start Date End Date Taking? Authorizing Provider  amLODipine (NORVASC) 5 MG tablet Take 5 mg by mouth daily.    [provider]  aspirin 81 MG tablet Take 81 mg by mouth daily.    [provider]  atorvastatin (LIPITOR) 10 MG tablet Take 10 mg by mouth daily.    [provider]  doxycycline (VIBRA-TABS) 100 MG tablet Take 1 tablet (100 mg total) by mouth 2 (two) times daily. 12/10/14   Copland, Gwenlyn Found, MD  lisinopril (PRINIVIL,ZESTRIL) 10 MG tablet Take 10 mg by mouth daily.    [provider]    Family  History Family History  Problem Relation Age of Onset  . Cancer Mother        colon  . Heart disease Father   . Diabetes Father   . Hypertension Father   . Hypertension Sister   . Heart disease Brother   . Hypertension Brother     Social History Social History   Tobacco Use  . Smoking status: Former Smoker    Packs/day: 1.00  Substance Use Topics  . Alcohol use: Yes    Alcohol/week: 0.0 standard drinks  . Drug use: No     Allergies   Patient has no known allergies.   Review of Systems Review of Systems  Constitutional: Positive for diaphoresis. Negative for chills and fever.  Eyes: Negative for visual disturbance.  Respiratory: Negative for shortness of breath.   Cardiovascular: Negative for chest pain.  Gastrointestinal: Positive for nausea. Negative for anal bleeding and vomiting.  Musculoskeletal: Negative for arthralgias and neck pain.  Neurological: Positive for dizziness and light-headedness. Negative for syncope and headaches.  All other systems reviewed and are negative.    Physical Exam Updated Vital Signs BP (!) 149/77 (BP Location: Left Arm)   Pulse 60   Temp 97.7 F (36.5 C) (Oral)   Resp 20   SpO2 98%   Physical Exam  Constitutional: He is oriented to person, place,  and time. He appears well-developed and well-nourished. No distress.  HENT:  Head: Normocephalic and atraumatic.  TMs are normal bilaterally  Eyes: Pupils are equal, round, and reactive to light. Conjunctivae are normal. Right eye exhibits no discharge. Left eye exhibits no discharge. No scleral icterus.  Neck: Normal range of motion.  Cardiovascular: Normal rate and regular rhythm.  Pulmonary/Chest: Effort normal and breath sounds normal. No respiratory distress.  Abdominal: Soft. Bowel sounds are normal. He exhibits no distension.  Neurological: He is alert and oriented to person, place, and time.  Mental Status:  Alert, oriented, thought content appropriate, able to give a  coherent history. Speech fluent without evidence of aphasia. Able to follow 2 step commands without difficulty.  Cranial Nerves:  II:  Peripheral visual fields grossly normal, pupils equal, round, reactive to light III,IV, VI: ptosis not present, extra-ocular motions intact bilaterally. Fatigable lateral nystagmus noted V,VII: smile symmetric, facial light touch sensation equal VIII: hearing grossly normal to voice  X: uvula elevates symmetrically  XI: bilateral shoulder shrug symmetric and strong XII: midline tongue extension without fassiculations Motor:  Normal tone. 5/5 in upper and lower extremities bilaterally including strong and equal grip strength and dorsiflexion/plantar flexion Sensory: Pinprick and light touch normal in all extremities.  Cerebellar: normal finger-to-nose with bilateral upper extremities Gait: normal gait and balance CV: distal pulses palpable throughout    Skin: Skin is warm and dry.  Psychiatric: He has a normal mood and affect. His behavior is normal.  Nursing note and vitals reviewed.    ED Treatments / Results  Labs (all labs ordered are listed, but only abnormal results are displayed) Labs Reviewed  BASIC METABOLIC PANEL - Abnormal; Notable for the following components:      Result Value   CO2 21 (*)    Glucose, Bld 132 (*)    All other components within normal limits  CBC WITH DIFFERENTIAL/PLATELET  I-STAT TROPONIN, ED    EKG EKG Interpretation  Date/Time:  Saturday February 07 2018 12:24:06 EDT Ventricular Rate:  63 PR Interval:    QRS Duration: 104 QT Interval:  447 QTC Calculation: 458 R Axis:   -2 Text Interpretation:  Sinus rhythm Left atrial enlargement Minimal ST elevation, anterior leads No significant change since last tracing Confirmed by Jacalyn Lefevre [phone]) on 02/07/2018 12:56:14 PM   Radiology No results found.  Procedures Procedures (including critical care time)  Medications Ordered in ED Medications  sodium  chloride 0.9 % bolus 1,000 mL (1,000 mLs Intravenous New Bag/Given 02/07/18 1322)  meclizine (ANTIVERT) tablet 25 mg (25 mg Oral Given 02/07/18 1321)  LORazepam (ATIVAN) injection 0.5 mg (0.5 mg Intravenous Given 02/07/18 1321)     Initial Impression / Assessment and Plan / ED Course  I have reviewed the triage vital signs and the nursing notes.  Pertinent labs & imaging results that were available during my care of the patient were reviewed by me and considered in my medical decision making (see chart for details).  62 year old male presents with sudden onset of severe dizziness while sitting at his computer today. It was not associated with pain. His neurologic exam is remarkable for lateral nystagmus but otherwise is non-focal. EKG is SR and appears unchanged. CBC is normal. BMP has mild hyperglycemia. Troponin is 0.02. He was given fluids, ativan, meclizine. Discussed initial results with family. We will order head CT for completion but likely symptoms are from vertigo. Delta trop will be due at around 4:30pm.   Reviewed the  results with the patient and family. At shift change CT is still pending. He was able to get up on the side of the bed and stand without significant dizziness. Care transferred to Nash Dimmer, MD. Pt can be discharged with ENT follow up as long as CT and delta trop are negative.   Final Clinical Impressions(s) / ED Diagnoses   Final diagnoses:  Dizziness    ED Discharge Orders    None       Bethel Born, PA-C 02/07/18 1536    Jacalyn Lefevre, MD 02/07/18 [phone]

## 2018-02-07 NOTE — ED Notes (Signed)
Patient verbalizes understanding of discharge instructions. Opportunity for questioning and answers were provided. Armband removed by staff, pt discharged from ED.  

## 2018-02-07 NOTE — ED Triage Notes (Signed)
Pt here from work. Pt was sitting at his desk when he suddenly became dizzy, nauseated, and diaphoretic. Reported increased dizziness with standing. Hypertensive on EMS arrival at 180/100. ST depression and incomplete left bundle block noted by EMS EKG. Nausea improved by zofran 4mg  given by EMS. Denies chest pain.

## 2018-02-07 NOTE — ED Notes (Signed)
Patient transported to CT 

## 2018-02-07 NOTE — Discharge Instructions (Signed)
Take Meclizine as needed for dizziness If you are worsening, please return to the ED. Otherwise, please follow up with Dr. Suszanne Conners.

## 2018-02-09 DIAGNOSIS — H6981 Other specified disorders of Eustachian tube, right ear: Secondary | ICD-10-CM | POA: Diagnosis not present

## 2018-02-09 DIAGNOSIS — I10 Essential (primary) hypertension: Secondary | ICD-10-CM | POA: Diagnosis not present

## 2018-02-09 DIAGNOSIS — J322 Chronic ethmoidal sinusitis: Secondary | ICD-10-CM | POA: Diagnosis not present

## 2018-02-09 DIAGNOSIS — Z23 Encounter for immunization: Secondary | ICD-10-CM | POA: Diagnosis not present

## 2018-02-09 DIAGNOSIS — R42 Dizziness and giddiness: Secondary | ICD-10-CM | POA: Diagnosis not present

## 2018-02-18 DIAGNOSIS — H8123 Vestibular neuronitis, bilateral: Secondary | ICD-10-CM | POA: Diagnosis not present

## 2018-03-23 DIAGNOSIS — L821 Other seborrheic keratosis: Secondary | ICD-10-CM | POA: Diagnosis not present

## 2018-03-23 DIAGNOSIS — L570 Actinic keratosis: Secondary | ICD-10-CM | POA: Diagnosis not present

## 2018-03-23 DIAGNOSIS — B0089 Other herpesviral infection: Secondary | ICD-10-CM | POA: Diagnosis not present

## 2018-03-23 DIAGNOSIS — D1801 Hemangioma of skin and subcutaneous tissue: Secondary | ICD-10-CM | POA: Diagnosis not present

## 2018-03-23 DIAGNOSIS — D2272 Melanocytic nevi of left lower limb, including hip: Secondary | ICD-10-CM | POA: Diagnosis not present

## 2018-06-04 DIAGNOSIS — Z6833 Body mass index (BMI) 33.0-33.9, adult: Secondary | ICD-10-CM | POA: Diagnosis not present

## 2018-06-04 DIAGNOSIS — R05 Cough: Secondary | ICD-10-CM | POA: Diagnosis not present

## 2018-06-04 DIAGNOSIS — J069 Acute upper respiratory infection, unspecified: Secondary | ICD-10-CM | POA: Diagnosis not present

## 2018-09-22 DIAGNOSIS — D1801 Hemangioma of skin and subcutaneous tissue: Secondary | ICD-10-CM | POA: Diagnosis not present

## 2018-09-22 DIAGNOSIS — L570 Actinic keratosis: Secondary | ICD-10-CM | POA: Diagnosis not present

## 2018-09-22 DIAGNOSIS — L812 Freckles: Secondary | ICD-10-CM | POA: Diagnosis not present

## 2018-09-22 DIAGNOSIS — L821 Other seborrheic keratosis: Secondary | ICD-10-CM | POA: Diagnosis not present

## 2018-09-22 DIAGNOSIS — L564 Polymorphous light eruption: Secondary | ICD-10-CM | POA: Diagnosis not present

## 2019-02-09 DIAGNOSIS — R1909 Other intra-abdominal and pelvic swelling, mass and lump: Secondary | ICD-10-CM | POA: Diagnosis not present

## 2019-02-09 DIAGNOSIS — L02214 Cutaneous abscess of groin: Secondary | ICD-10-CM | POA: Diagnosis not present

## 2019-02-10 DIAGNOSIS — Z125 Encounter for screening for malignant neoplasm of prostate: Secondary | ICD-10-CM | POA: Diagnosis not present

## 2019-02-10 DIAGNOSIS — E7849 Other hyperlipidemia: Secondary | ICD-10-CM | POA: Diagnosis not present

## 2019-02-10 DIAGNOSIS — E538 Deficiency of other specified B group vitamins: Secondary | ICD-10-CM | POA: Diagnosis not present

## 2019-02-10 DIAGNOSIS — Z0000 Encounter for general adult medical examination without abnormal findings: Secondary | ICD-10-CM | POA: Diagnosis not present

## 2019-02-10 DIAGNOSIS — I10 Essential (primary) hypertension: Secondary | ICD-10-CM | POA: Diagnosis not present

## 2019-02-12 DIAGNOSIS — R82998 Other abnormal findings in urine: Secondary | ICD-10-CM | POA: Diagnosis not present

## 2019-02-12 DIAGNOSIS — I10 Essential (primary) hypertension: Secondary | ICD-10-CM | POA: Diagnosis not present

## 2019-02-17 DIAGNOSIS — Z0000 Encounter for general adult medical examination without abnormal findings: Secondary | ICD-10-CM | POA: Diagnosis not present

## 2019-02-17 DIAGNOSIS — I2510 Atherosclerotic heart disease of native coronary artery without angina pectoris: Secondary | ICD-10-CM | POA: Diagnosis not present

## 2019-02-17 DIAGNOSIS — Z1331 Encounter for screening for depression: Secondary | ICD-10-CM | POA: Diagnosis not present

## 2019-02-17 DIAGNOSIS — J329 Chronic sinusitis, unspecified: Secondary | ICD-10-CM | POA: Diagnosis not present

## 2019-02-17 DIAGNOSIS — L02214 Cutaneous abscess of groin: Secondary | ICD-10-CM | POA: Diagnosis not present

## 2019-02-17 DIAGNOSIS — I7090 Unspecified atherosclerosis: Secondary | ICD-10-CM | POA: Diagnosis not present

## 2019-02-23 ENCOUNTER — Other Ambulatory Visit: Payer: Self-pay | Admitting: Internal Medicine

## 2019-02-23 DIAGNOSIS — R911 Solitary pulmonary nodule: Secondary | ICD-10-CM

## 2019-02-26 DIAGNOSIS — Z23 Encounter for immunization: Secondary | ICD-10-CM | POA: Diagnosis not present

## 2019-02-26 DIAGNOSIS — R7301 Impaired fasting glucose: Secondary | ICD-10-CM | POA: Diagnosis not present

## 2019-03-08 ENCOUNTER — Other Ambulatory Visit: Payer: Self-pay

## 2019-03-08 ENCOUNTER — Ambulatory Visit
Admission: RE | Admit: 2019-03-08 | Discharge: 2019-03-08 | Disposition: A | Discharge status: Home / Self Care | Payer: No Typology Code available for payment source | Source: Ambulatory Visit | Attending: Internal Medicine | Admitting: Internal Medicine

## 2019-03-08 DIAGNOSIS — R911 Solitary pulmonary nodule: Secondary | ICD-10-CM

## 2019-03-24 DIAGNOSIS — Z85828 Personal history of other malignant neoplasm of skin: Secondary | ICD-10-CM | POA: Diagnosis not present

## 2019-03-24 DIAGNOSIS — L570 Actinic keratosis: Secondary | ICD-10-CM | POA: Diagnosis not present

## 2019-03-24 DIAGNOSIS — L812 Freckles: Secondary | ICD-10-CM | POA: Diagnosis not present

## 2019-07-02 DIAGNOSIS — Z20828 Contact with and (suspected) exposure to other viral communicable diseases: Secondary | ICD-10-CM | POA: Diagnosis not present

## 2019-08-14 ENCOUNTER — Ambulatory Visit: Payer: Self-pay | Attending: Internal Medicine

## 2019-08-14 DIAGNOSIS — Z23 Encounter for immunization: Secondary | ICD-10-CM

## 2019-08-14 NOTE — Progress Notes (Signed)
   Covid-19 Vaccination Clinic  Name:  Jeremy Rios    MRN: [id number] DOB: [birth year]  08/14/2019  Jeremy Rios was observed post Covid-19 immunization for 15 minutes without incident. He was provided with Vaccine Information Sheet and instruction to access the V-Safe system.   Jeremy Rios was instructed to call 911 with any severe reactions post vaccine: Marland Kitchen Difficulty breathing  . Swelling of face and throat  . A fast heartbeat  . A bad rash all over body  . Dizziness and weakness   Immunizations Administered    Name Date Dose VIS Date Route   Pfizer COVID-19 Vaccine 08/14/2019  8:26 AM 0.3 mL 05/14/2019 Intramuscular   Manufacturer: [employer], [employer]   Lot: [id number]   NDC: [address]-3

## 2019-09-06 ENCOUNTER — Ambulatory Visit: Payer: Self-pay | Attending: Internal Medicine

## 2019-09-06 ENCOUNTER — Ambulatory Visit: Payer: Self-pay

## 2019-09-06 DIAGNOSIS — Z23 Encounter for immunization: Secondary | ICD-10-CM

## 2019-09-06 NOTE — Progress Notes (Signed)
   Covid-19 Vaccination Clinic  Name:  TARIN NAVAREZ    MRN: [id number] DOB: Feb 13, 1956  09/06/2019  Mr. Pellot was observed post Covid-19 immunization for 15 minutes without incident. He was provided with Vaccine Information Sheet and instruction to access the V-Safe system.   Mr. Busta was instructed to call 911 with any severe reactions post vaccine: Marland Kitchen Difficulty breathing  . Swelling of face and throat  . A fast heartbeat  . A bad rash all over body  . Dizziness and weakness   Immunizations Administered    Name Date Dose VIS Date Route   Pfizer COVID-19 Vaccine 09/06/2019  1:19 PM 0.3 mL 05/14/2019 Intramuscular   Manufacturer: [employer], [employer]   Lot: [id number]   NDC: [address]-9

## 2020-03-17 ENCOUNTER — Other Ambulatory Visit: Payer: Self-pay | Admitting: Internal Medicine

## 2020-03-17 DIAGNOSIS — E785 Hyperlipidemia, unspecified: Secondary | ICD-10-CM

## 2020-04-03 ENCOUNTER — Ambulatory Visit
Admission: RE | Admit: 2020-04-03 | Discharge: 2020-04-03 | Disposition: A | Discharge status: Home / Self Care | Payer: No Typology Code available for payment source | Source: Ambulatory Visit | Attending: Internal Medicine | Admitting: Internal Medicine

## 2020-04-03 DIAGNOSIS — E785 Hyperlipidemia, unspecified: Secondary | ICD-10-CM

## 2020-08-07 IMAGING — CT CT CHEST LUNG CANCER SCREENING LOW DOSE W/O CM
1 series · 15 of 33 positions shown, 19 images · non-contrast
Comparison: 08/29/2014.

CLINICAL DATA: Former smoker, quit 4 years ago, 42 pack-year
history.

EXAM:
CT CHEST WITHOUT CONTRAST LOW-DOSE FOR LUNG CANCER SCREENING
TECHNIQUE: Multidetector CT imaging of the chest was performed following the
standard protocol without IV contrast.

[Series 2: ldct screening <30 bmi · axial · 0.79mm/px · z∈[-398,-134]mm · 15 of 63 slices shown, 19 images]
[im 5/63  mediastinal]
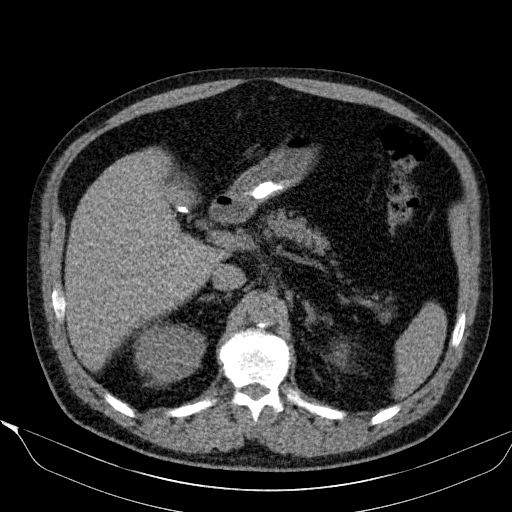
[im 5/63  lung]
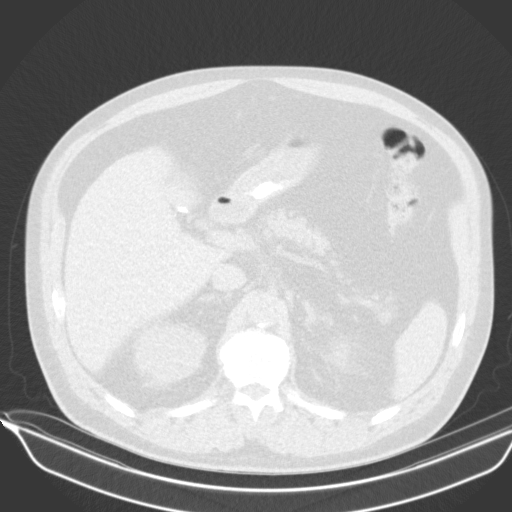
[im 10/63  lung]
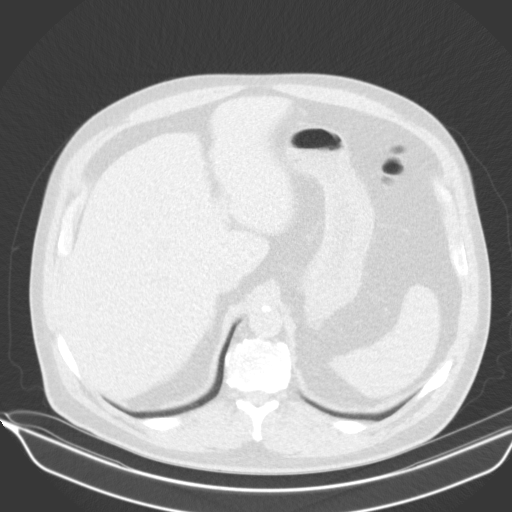
[im 13/63  lung]
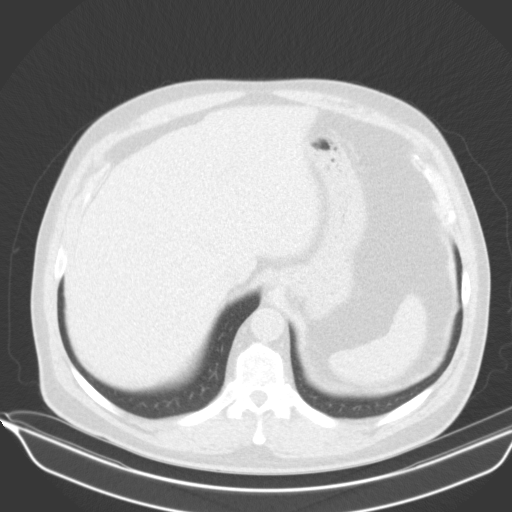
[im 17/63  lung]
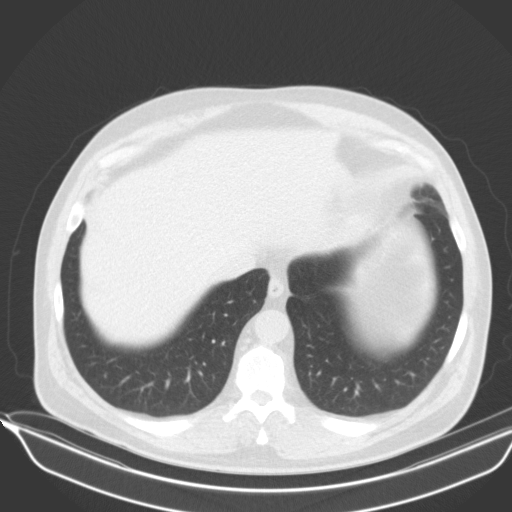
[im 21/63  mediastinal]
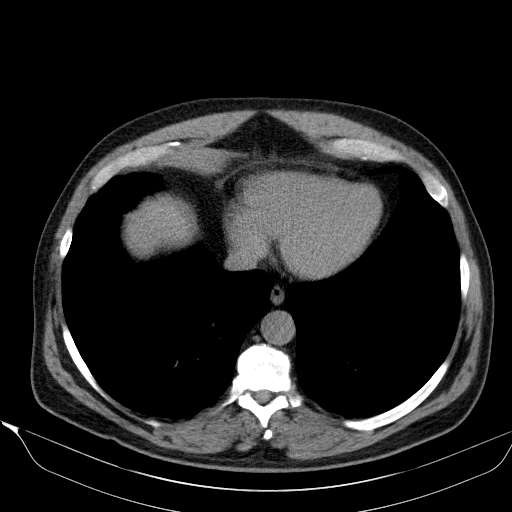
[im 21/63  lung]
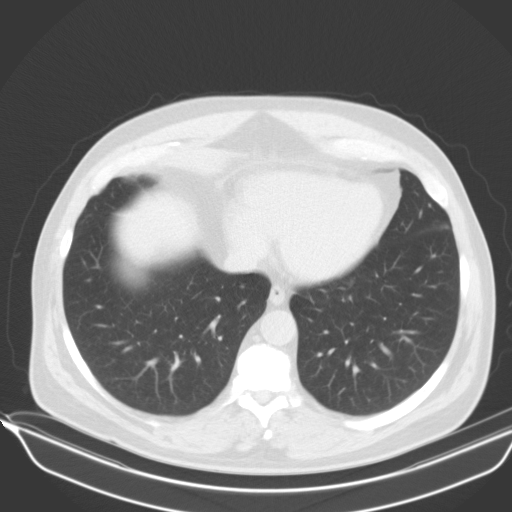
[im 25/63  lung]
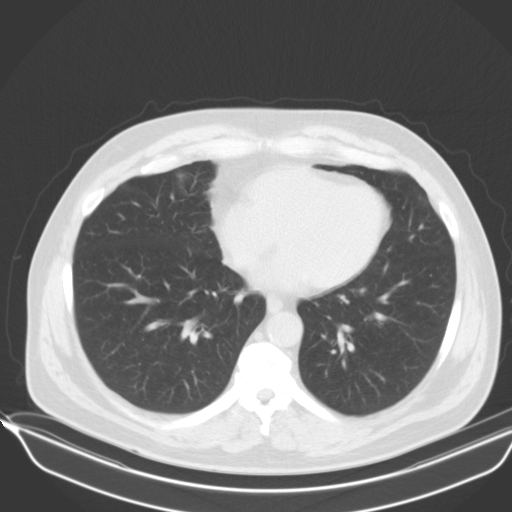
[im 28/63  lung]
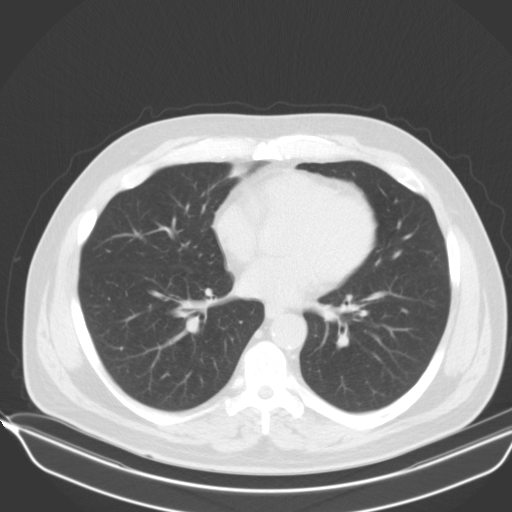
[im 33/63  lung]
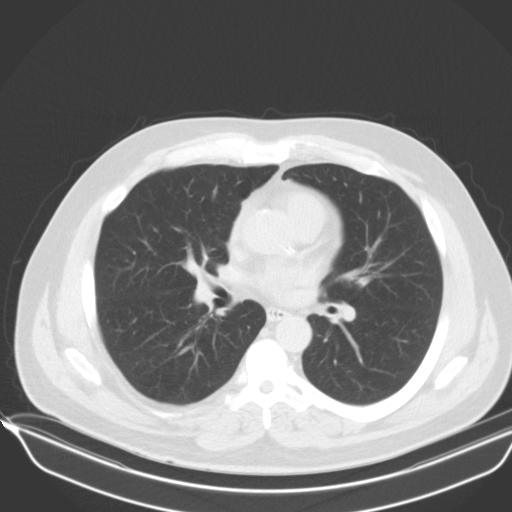
[im 35/63  mediastinal]
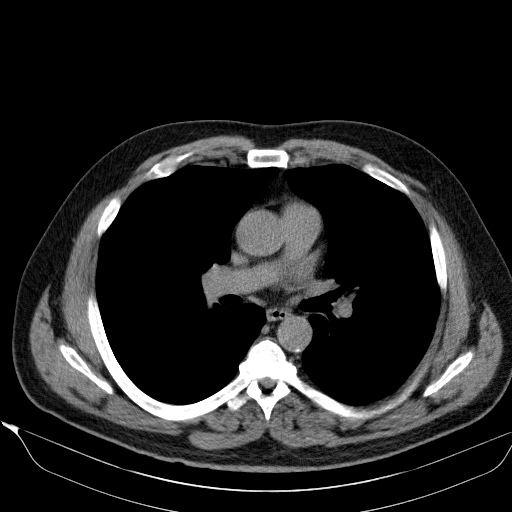
[im 35/63  lung]
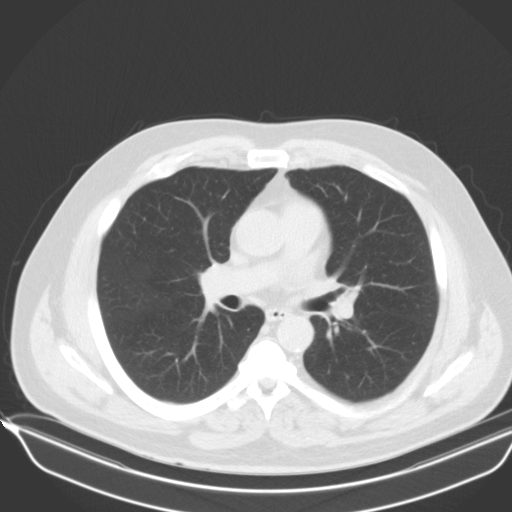
[im 38/63  lung]
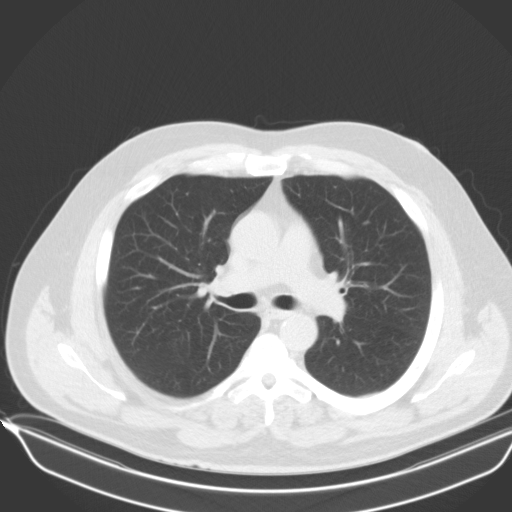
[im 42/63  lung]
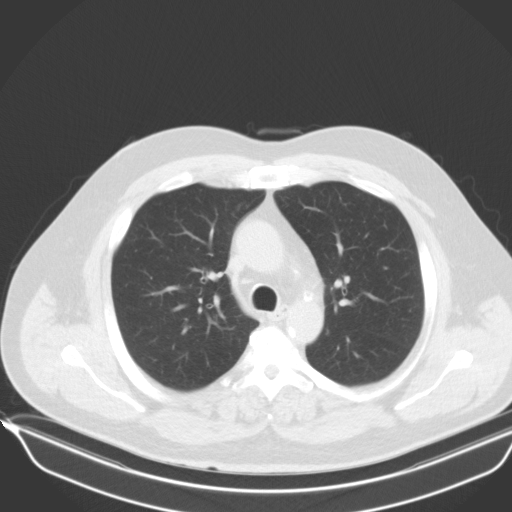
[im 46/63  lung]
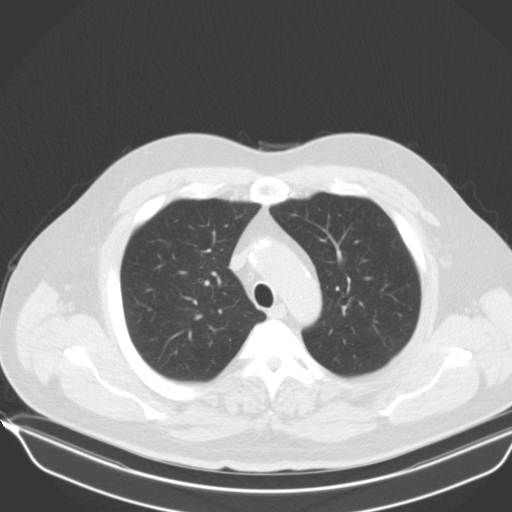
[im 50/63  mediastinal]
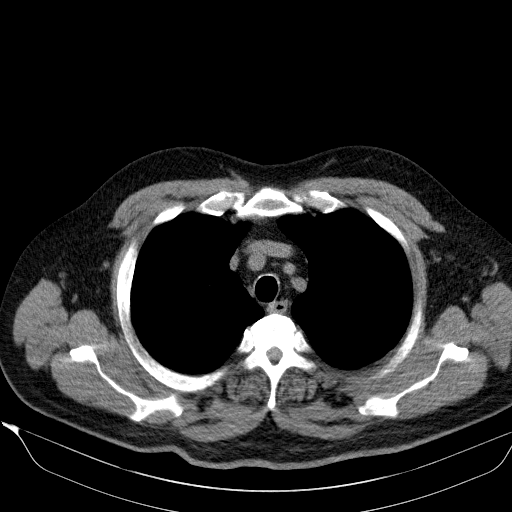
[im 50/63  lung]
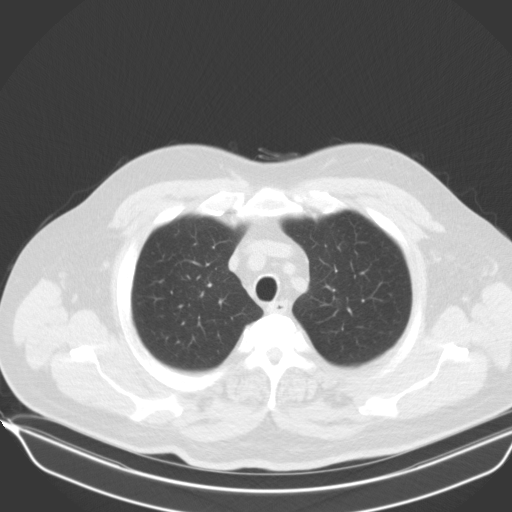
[im 53/63  lung]
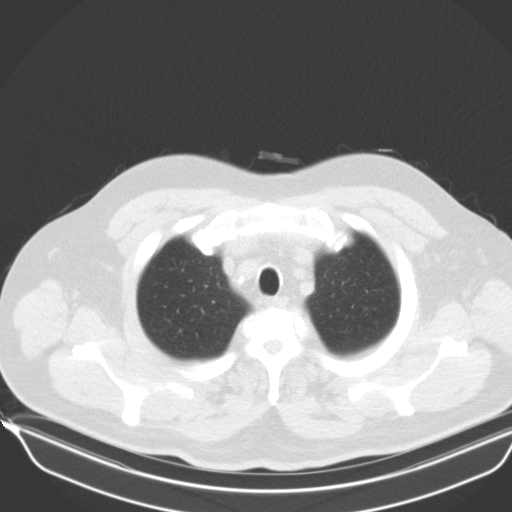
[im 58/63  lung]
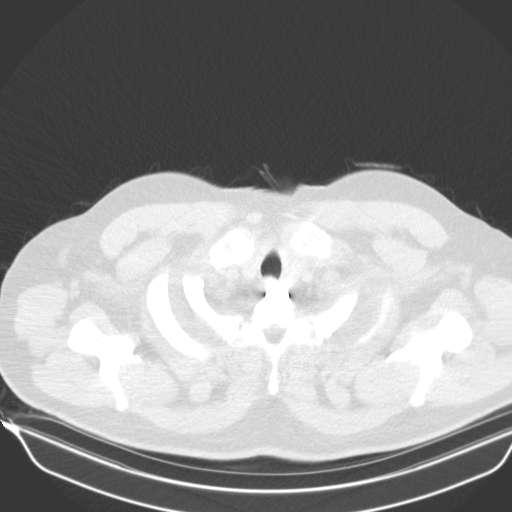

[15 of 33 positions shown; findings below may reference images not displayed]

FINDINGS: Cardiovascular: Atherosclerotic calcification of the aorta and
coronary arteries. Heart size normal. No pericardial effusion.

Mediastinum/Nodes: No pathologically enlarged mediastinal or
axillary lymph nodes. Hilar regions are difficult to definitively
evaluate without IV contrast but appear grossly unremarkable.
Esophagus is grossly unremarkable.

Lungs/Pleura: Mild scarring in the right middle lobe and lingula.
Pulmonary nodules measure 2.8 mm or less in size. No new or
worrisome pulmonary nodules. No pleural fluid. Airway is
unremarkable.

Upper Abdomen: Visualized portions of the liver, adrenal glands,
kidneys, spleen, pancreas, stomach and bowel are grossly
unremarkable. Cholecystectomy. No upper abdominal adenopathy.

Musculoskeletal: Degenerative changes in the spine. No worrisome
lytic or sclerotic lesions.
IMPRESSION: 1. Lung-RADS 2, benign appearance or behavior. Continue annual
screening with low-dose chest CT without contrast in 12 months.
2. Aortic atherosclerosis ([id number]). Coronary artery
calcification.

## 2021-04-13 ENCOUNTER — Other Ambulatory Visit: Payer: Self-pay | Admitting: Internal Medicine

## 2021-04-13 DIAGNOSIS — F17209 Nicotine dependence, unspecified, with unspecified nicotine-induced disorders: Secondary | ICD-10-CM

## 2021-05-11 ENCOUNTER — Ambulatory Visit
Admission: RE | Admit: 2021-05-11 | Discharge: 2021-05-11 | Disposition: A | Discharge status: Home / Self Care | Payer: Medicare Other | Source: Ambulatory Visit | Attending: Internal Medicine | Admitting: Internal Medicine

## 2021-05-11 DIAGNOSIS — F17209 Nicotine dependence, unspecified, with unspecified nicotine-induced disorders: Secondary | ICD-10-CM

## 2022-04-17 ENCOUNTER — Other Ambulatory Visit: Payer: Self-pay | Admitting: Internal Medicine

## 2022-04-17 DIAGNOSIS — F17200 Nicotine dependence, unspecified, uncomplicated: Secondary | ICD-10-CM

## 2022-04-29 ENCOUNTER — Ambulatory Visit
Admission: RE | Admit: 2022-04-29 | Discharge: 2022-04-29 | Disposition: A | Discharge status: Home / Self Care | Payer: Medicare Other | Source: Ambulatory Visit | Attending: Internal Medicine | Admitting: Internal Medicine

## 2022-04-29 DIAGNOSIS — F17200 Nicotine dependence, unspecified, uncomplicated: Secondary | ICD-10-CM

## 2022-07-17 DIAGNOSIS — K648 Other hemorrhoids: Secondary | ICD-10-CM | POA: Diagnosis not present

## 2022-07-17 DIAGNOSIS — K635 Polyp of colon: Secondary | ICD-10-CM | POA: Diagnosis not present

## 2022-07-17 DIAGNOSIS — Z1211 Encounter for screening for malignant neoplasm of colon: Secondary | ICD-10-CM | POA: Diagnosis not present

## 2022-07-17 DIAGNOSIS — K5730 Diverticulosis of large intestine without perforation or abscess without bleeding: Secondary | ICD-10-CM | POA: Diagnosis not present

## 2022-07-17 DIAGNOSIS — Z800 Family history of malignant neoplasm of digestive organs: Secondary | ICD-10-CM | POA: Diagnosis not present

## 2022-10-02 DIAGNOSIS — D1801 Hemangioma of skin and subcutaneous tissue: Secondary | ICD-10-CM | POA: Diagnosis not present

## 2022-10-02 DIAGNOSIS — L821 Other seborrheic keratosis: Secondary | ICD-10-CM | POA: Diagnosis not present

## 2022-10-02 DIAGNOSIS — L812 Freckles: Secondary | ICD-10-CM | POA: Diagnosis not present

## 2022-10-02 DIAGNOSIS — B0089 Other herpesviral infection: Secondary | ICD-10-CM | POA: Diagnosis not present

## 2022-10-02 DIAGNOSIS — L570 Actinic keratosis: Secondary | ICD-10-CM | POA: Diagnosis not present

## 2023-04-08 DIAGNOSIS — D1801 Hemangioma of skin and subcutaneous tissue: Secondary | ICD-10-CM | POA: Diagnosis not present

## 2023-04-08 DIAGNOSIS — L812 Freckles: Secondary | ICD-10-CM | POA: Diagnosis not present

## 2023-04-08 DIAGNOSIS — L570 Actinic keratosis: Secondary | ICD-10-CM | POA: Diagnosis not present

## 2023-05-06 DIAGNOSIS — H33301 Unspecified retinal break, right eye: Secondary | ICD-10-CM | POA: Diagnosis not present

## 2023-05-06 DIAGNOSIS — H524 Presbyopia: Secondary | ICD-10-CM | POA: Diagnosis not present

## 2023-05-15 DIAGNOSIS — H35361 Drusen (degenerative) of macula, right eye: Secondary | ICD-10-CM | POA: Diagnosis not present

## 2023-05-15 DIAGNOSIS — H43811 Vitreous degeneration, right eye: Secondary | ICD-10-CM | POA: Diagnosis not present

## 2023-05-15 DIAGNOSIS — H43822 Vitreomacular adhesion, left eye: Secondary | ICD-10-CM | POA: Diagnosis not present

## 2023-05-15 DIAGNOSIS — H33321 Round hole, right eye: Secondary | ICD-10-CM | POA: Diagnosis not present

## 2023-05-15 DIAGNOSIS — H2513 Age-related nuclear cataract, bilateral: Secondary | ICD-10-CM | POA: Diagnosis not present

## 2023-05-21 DIAGNOSIS — Z23 Encounter for immunization: Secondary | ICD-10-CM | POA: Diagnosis not present

## 2023-06-19 DIAGNOSIS — H2513 Age-related nuclear cataract, bilateral: Secondary | ICD-10-CM | POA: Diagnosis not present

## 2023-06-19 DIAGNOSIS — H35361 Drusen (degenerative) of macula, right eye: Secondary | ICD-10-CM | POA: Diagnosis not present

## 2023-06-19 DIAGNOSIS — H43822 Vitreomacular adhesion, left eye: Secondary | ICD-10-CM | POA: Diagnosis not present

## 2023-06-19 DIAGNOSIS — H43811 Vitreous degeneration, right eye: Secondary | ICD-10-CM | POA: Diagnosis not present

## 2023-07-01 DIAGNOSIS — R7301 Impaired fasting glucose: Secondary | ICD-10-CM | POA: Diagnosis not present

## 2023-07-01 DIAGNOSIS — Z1212 Encounter for screening for malignant neoplasm of rectum: Secondary | ICD-10-CM | POA: Diagnosis not present

## 2023-07-01 DIAGNOSIS — E785 Hyperlipidemia, unspecified: Secondary | ICD-10-CM | POA: Diagnosis not present

## 2023-07-01 DIAGNOSIS — Z125 Encounter for screening for malignant neoplasm of prostate: Secondary | ICD-10-CM | POA: Diagnosis not present

## 2023-07-01 DIAGNOSIS — E538 Deficiency of other specified B group vitamins: Secondary | ICD-10-CM | POA: Diagnosis not present

## 2023-07-08 DIAGNOSIS — Z1331 Encounter for screening for depression: Secondary | ICD-10-CM | POA: Diagnosis not present

## 2023-07-08 DIAGNOSIS — Z23 Encounter for immunization: Secondary | ICD-10-CM | POA: Diagnosis not present

## 2023-07-08 DIAGNOSIS — Z1339 Encounter for screening examination for other mental health and behavioral disorders: Secondary | ICD-10-CM | POA: Diagnosis not present

## 2023-07-08 DIAGNOSIS — Z0000 Encounter for general adult medical examination without abnormal findings: Secondary | ICD-10-CM | POA: Diagnosis not present

## 2023-07-08 DIAGNOSIS — I10 Essential (primary) hypertension: Secondary | ICD-10-CM | POA: Diagnosis not present

## 2023-07-08 DIAGNOSIS — J439 Emphysema, unspecified: Secondary | ICD-10-CM | POA: Diagnosis not present

## 2023-07-15 DIAGNOSIS — Z87891 Personal history of nicotine dependence: Secondary | ICD-10-CM | POA: Diagnosis not present

## 2023-09-11 NOTE — Progress Notes (Unsigned)
 Cardiology Office Note:  .   Date:  09/12/2023  ID:  Rolin Barry, DOB 05-Nov-1955, MRN [id number] PCP: Rodrigo Ran, MD  [facility] Providers Cardiologist:  None { History of Present Illness: .    Chief Complaint  Patient presents with   Coronary Artery Disease         Jeremy Rios is a 68 y.o. male with history of CAC who presents for the evaluation of CAC at the request of Rodrigo Ran, MD.   History of Present Illness   Jeremy Rios is a 68 year old male with a history of elevated coronary calcium score who presents for evaluation of elevated coronary calcium score. He was referred by Dr. Waynard Edwards for evaluation of elevated coronary calcium score.  He has an elevated coronary calcium score, last measured at 261, placing him in the 72nd percentile. This score was obtained a couple of years ago. No history of chest pain or trouble breathing, but he experiences occasional pressure in the chest area, typically when in bed, which he associates with possible acid reflux, especially after consuming certain foods.  He has a history of hypertension and is currently taking losartan 75 mg daily. His blood pressure was recorded at 144/98 and 140/90 during this visit. He describes experiencing 'white coat hypertension.'  There is a significant family history of heart disease, with his father and paternal grandfather both having myocardial infarctions at the age of 78.  He is currently on atorvastatin 40 mg daily, with a recent LDL level of 53. He is not taking aspirin. He maintains a high level of physical activity, including daily yoga, planking, elliptical workouts, and walking his dogs. He also plays golf regularly.  He is a former smoker, having quit in July 2015 after smoking for about 30 years. He consumes 10 to 12 beers per week, sometimes more or less. He is married with two children, aged 52 and 32, and has one grandchild with another on the way. 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          Problem List CAD -CAC 261 (72nd percentile) 2. HLD -T chol 114, HDL 45, LDL 53, TG 78 3. HTN    ROS: All other ROS reviewed and negative. Pertinent positives noted in the HPI.     Studies Reviewed: Marland Kitchen   EKG Interpretation Date/Time:  Friday September 12 2023 08:24:00 EDT Ventricular Rate:  60 PR Interval:  190 QRS Duration:  100 QT Interval:  428 QTC Calculation: 428 R Axis:   9  Text Interpretation: Normal sinus rhythm Normal ECG Confirmed by Lennie Odor [phone]) on 09/12/2023 8:29:22 AM   CAC 04/03/2020 IMPRESSION: 1. Patient's total coronary artery calcium score is 261 which is 72nd percentile for patient's of matched age, gender and race/ethnicity. Please note that although the presence of coronary artery calcium documents the presence of coronary artery disease, the severity of this disease and any potential stenosis cannot be assessed on this noncontrast CT examination. Assessment for potential risk factor modification, dietary therapy or pharmacologic therapy may be warranted, if clinically indicated. 2.  Aortic Atherosclerosis (ICD10-I70.0). Physical Exam:   VS:  BP (!) 144/98 (BP Location: Left Arm, Patient Position: Sitting)   Pulse 60   Ht 5\' 6"  (1.676 m)   Wt 193 lb 12.8 oz (87.9 kg)   SpO2 100%   BMI 31.28 kg/m    Wt Readings from Last 3 Encounters:  09/12/23  193 lb 12.8 oz (87.9 kg)  12/10/14 188 lb 3.2 oz (85.4 kg)  12/02/13 180 lb (81.6 kg)    GEN: Well nourished, well developed in no acute distress NECK: No JVD; No carotid bruits CARDIAC: RRR, no murmurs, rubs, gallops RESPIRATORY:  Clear to auscultation without rales, wheezing or rhonchi  ABDOMEN: Soft, non-tender, non-distended EXTREMITIES:  No edema; No deformity  ASSESSMENT AND PLAN: .   Assessment and Plan    Elevated coronary calcium score Coronary calcium score of 261 in the 72nd percentile. No personal cardiac  events. LDL controlled at 53 mg/dL on atorvastatin. Aspirin therapy unnecessary. Emphasized regular exercise and healthy lifestyle. - Continue atorvastatin 40 mg daily. - Reassess coronary calcium score in 1 year if clinically indicated. - Monitor for new symptoms such as chest pain or shortness of breath.  Hyperlipidemia LDL at 53 mg/dL on atorvastatin, within target range for cardiovascular risk reduction. - Continue atorvastatin 40 mg daily.  Hypertension Blood pressure slightly elevated at 144/98 mmHg, repeat 140/90 mmHg. White coat hypertension reported. Goal to maintain readings below 140/90 mmHg. - Continue losartan 75 mg daily. - Monitor blood pressure regularly. - Reassess management if readings consistently exceed 140/90 mmHg.              Follow-up: Return in about 1 year (around 09/11/2024).  Signed, Lenna Gilford. Flora Lipps, MD, [facility]  [facility]  [address], [address] [place], [place] [address] [phone]  8:49 AM

## 2023-09-12 ENCOUNTER — Encounter: Payer: Self-pay | Admitting: Cardiovascular Disease

## 2023-09-12 ENCOUNTER — Ambulatory Visit: Payer: BC Managed Care – PPO | Attending: Cardiovascular Disease | Admitting: Cardiovascular Disease

## 2023-09-12 VITALS — BP 144/98 | HR 60 | Ht 66.0 in | Wt 193.8 lb

## 2023-09-12 DIAGNOSIS — I150 Renovascular hypertension: Secondary | ICD-10-CM

## 2023-09-12 DIAGNOSIS — R931 Abnormal findings on diagnostic imaging of heart and coronary circulation: Secondary | ICD-10-CM | POA: Diagnosis not present

## 2023-09-12 DIAGNOSIS — E782 Mixed hyperlipidemia: Secondary | ICD-10-CM

## 2023-09-12 NOTE — Patient Instructions (Signed)
 Medication Instructions:  Your physician recommends that you continue on your current medications as directed. Please refer to the Current Medication list given to you today.    *If you need a refill on your cardiac medications before your next appointment, please call your pharmacy*   Lab Work: None    If you have labs (blood work) drawn today and your tests are completely normal, you will receive your results only by: MyChart Message (if you have MyChart) OR A paper copy in the mail If you have any lab test that is abnormal or we need to change your treatment, we will call you to review the results.   Testing/Procedures:  None   Follow-Up: At [facility], you and your health needs are our priority.  As part of our continuing mission to provide you with exceptional heart care, we have created designated Provider Care Teams.  These Care Teams include your primary Cardiologist (physician) and Advanced Practice Providers (APPs -  Physician Assistants and Nurse Practitioners) who all work together to provide you with the care you need, when you need it.  We recommend signing up for the patient portal called "MyChart".  Sign up information is provided on this After Visit Summary.  MyChart is used to connect with patients for Virtual Visits (Telemedicine).  Patients are able to view lab/test results, encounter notes, upcoming appointments, etc.  Non-urgent messages can be sent to your provider as well.   To learn more about what you can do with MyChart, go to [email or web address].    Your next appointment:   1 year(s)  The format for your next appointment:   In Person  Provider:   Edd Fabian, FNP, Micah Flesher, PA-C, Marjie Skiff, PA-C, Robet Leu, PA-C, Azalee Course, PA-C, or Reather Littler, NP       Other Instructions

## 2023-10-02 DIAGNOSIS — H43822 Vitreomacular adhesion, left eye: Secondary | ICD-10-CM | POA: Diagnosis not present

## 2023-10-02 DIAGNOSIS — H31091 Other chorioretinal scars, right eye: Secondary | ICD-10-CM | POA: Diagnosis not present

## 2023-10-02 DIAGNOSIS — H35361 Drusen (degenerative) of macula, right eye: Secondary | ICD-10-CM | POA: Diagnosis not present

## 2023-10-02 DIAGNOSIS — H43811 Vitreous degeneration, right eye: Secondary | ICD-10-CM | POA: Diagnosis not present

## 2023-10-07 DIAGNOSIS — L570 Actinic keratosis: Secondary | ICD-10-CM | POA: Diagnosis not present

## 2023-10-07 DIAGNOSIS — D1801 Hemangioma of skin and subcutaneous tissue: Secondary | ICD-10-CM | POA: Diagnosis not present

## 2023-10-07 DIAGNOSIS — L812 Freckles: Secondary | ICD-10-CM | POA: Diagnosis not present
